# Patient Record
Sex: Male | Born: 1970 | Race: White | Hispanic: No | Marital: Married | Smoking: Never smoker
Health system: Southern US, Community
[De-identification: ages and names within clinical notes are randomized; demographics above are authoritative.]

## PROBLEM LIST (undated history)

## (undated) DIAGNOSIS — C8590 Non-Hodgkin lymphoma, unspecified, unspecified site: Secondary | ICD-10-CM

## (undated) DIAGNOSIS — F1010 Alcohol abuse, uncomplicated: Secondary | ICD-10-CM

## (undated) HISTORY — PX: BACK SURGERY: SHX140

## (undated) HISTORY — PX: CHOLECYSTECTOMY: SHX55

## (undated) HISTORY — PX: OTHER SURGICAL HISTORY: SHX169

## (undated) HISTORY — PX: INNER EAR SURGERY: SHX679

## (undated) HISTORY — PX: KNEE ARTHROSCOPY: SUR90

---

## 1999-10-06 ENCOUNTER — Ambulatory Visit (HOSPITAL_BASED_OUTPATIENT_CLINIC_OR_DEPARTMENT_OTHER): Admission: RE | Admit: 1999-10-06 | Discharge: 1999-10-06 | Payer: Self-pay | Admitting: Orthopedic Surgery

## 2000-02-06 ENCOUNTER — Encounter: Payer: Self-pay | Admitting: Emergency Medicine

## 2000-02-06 ENCOUNTER — Emergency Department (HOSPITAL_COMMUNITY): Admission: EM | Admit: 2000-02-06 | Discharge: 2000-02-06 | Payer: Self-pay | Admitting: Emergency Medicine

## 2002-06-15 DIAGNOSIS — C8590 Non-Hodgkin lymphoma, unspecified, unspecified site: Secondary | ICD-10-CM

## 2002-06-15 HISTORY — PX: AXILLARY LYMPH NODE BIOPSY: SHX5737

## 2002-06-15 HISTORY — DX: Non-Hodgkin lymphoma, unspecified, unspecified site: C85.90

## 2004-02-28 DIAGNOSIS — C8580 Other specified types of non-Hodgkin lymphoma, unspecified site: Secondary | ICD-10-CM | POA: Insufficient documentation

## 2004-03-03 ENCOUNTER — Other Ambulatory Visit: Admission: RE | Admit: 2004-03-03 | Discharge: 2004-03-03 | Payer: Self-pay | Admitting: Unknown Physician Specialty

## 2004-03-19 ENCOUNTER — Encounter (INDEPENDENT_AMBULATORY_CARE_PROVIDER_SITE_OTHER): Payer: Self-pay | Admitting: Oncology

## 2004-03-19 ENCOUNTER — Other Ambulatory Visit: Admission: RE | Admit: 2004-03-19 | Discharge: 2004-03-19 | Payer: Self-pay | Admitting: Oncology

## 2004-03-24 ENCOUNTER — Ambulatory Visit: Admission: RE | Admit: 2004-03-24 | Discharge: 2004-04-22 | Payer: Self-pay | Admitting: Radiation Oncology

## 2004-04-21 ENCOUNTER — Ambulatory Visit: Payer: Self-pay | Admitting: Oncology

## 2004-05-06 ENCOUNTER — Ambulatory Visit: Payer: Self-pay | Admitting: Hematology

## 2004-05-27 ENCOUNTER — Ambulatory Visit: Admission: RE | Admit: 2004-05-27 | Discharge: 2004-07-17 | Payer: Self-pay | Admitting: Radiation Oncology

## 2004-06-18 ENCOUNTER — Ambulatory Visit: Payer: Self-pay | Admitting: Oncology

## 2004-08-13 ENCOUNTER — Ambulatory Visit: Payer: Self-pay | Admitting: Oncology

## 2004-08-20 ENCOUNTER — Ambulatory Visit: Admission: RE | Admit: 2004-08-20 | Discharge: 2004-08-20 | Payer: Self-pay | Admitting: Radiation Oncology

## 2004-11-07 ENCOUNTER — Ambulatory Visit: Payer: Self-pay | Admitting: Oncology

## 2005-01-21 ENCOUNTER — Ambulatory Visit: Payer: Self-pay | Admitting: Oncology

## 2005-02-17 ENCOUNTER — Ambulatory Visit: Payer: Self-pay | Admitting: Critical Care Medicine

## 2005-02-20 ENCOUNTER — Ambulatory Visit: Admission: RE | Admit: 2005-02-20 | Discharge: 2005-02-20 | Payer: Self-pay | Admitting: Internal Medicine

## 2005-02-20 ENCOUNTER — Ambulatory Visit: Payer: Self-pay | Admitting: Critical Care Medicine

## 2005-02-20 ENCOUNTER — Encounter (INDEPENDENT_AMBULATORY_CARE_PROVIDER_SITE_OTHER): Payer: Self-pay | Admitting: Specialist

## 2005-02-26 ENCOUNTER — Ambulatory Visit: Payer: Self-pay | Admitting: Critical Care Medicine

## 2005-03-11 ENCOUNTER — Ambulatory Visit: Payer: Self-pay | Admitting: Oncology

## 2005-04-30 ENCOUNTER — Ambulatory Visit: Payer: Self-pay | Admitting: Critical Care Medicine

## 2005-04-30 ENCOUNTER — Ambulatory Visit: Payer: Self-pay | Admitting: Oncology

## 2005-05-02 ENCOUNTER — Emergency Department (HOSPITAL_COMMUNITY): Admission: EM | Admit: 2005-05-02 | Discharge: 2005-05-02 | Payer: Self-pay | Admitting: Emergency Medicine

## 2005-05-04 ENCOUNTER — Encounter (INDEPENDENT_AMBULATORY_CARE_PROVIDER_SITE_OTHER): Payer: Self-pay | Admitting: Specialist

## 2005-05-04 ENCOUNTER — Ambulatory Visit: Payer: Self-pay | Admitting: Critical Care Medicine

## 2005-05-04 ENCOUNTER — Ambulatory Visit: Admission: RE | Admit: 2005-05-04 | Discharge: 2005-05-04 | Payer: Self-pay | Admitting: Critical Care Medicine

## 2005-05-06 ENCOUNTER — Ambulatory Visit (HOSPITAL_COMMUNITY): Admission: RE | Admit: 2005-05-06 | Discharge: 2005-05-06 | Payer: Self-pay | Admitting: Critical Care Medicine

## 2005-07-13 ENCOUNTER — Ambulatory Visit: Payer: Self-pay | Admitting: Oncology

## 2005-07-28 ENCOUNTER — Observation Stay (HOSPITAL_COMMUNITY): Admission: EM | Admit: 2005-07-28 | Discharge: 2005-07-29 | Payer: Self-pay | Admitting: Emergency Medicine

## 2005-07-28 ENCOUNTER — Ambulatory Visit: Payer: Self-pay | Admitting: Family Medicine

## 2005-10-05 ENCOUNTER — Ambulatory Visit: Payer: Self-pay | Admitting: Oncology

## 2006-03-31 ENCOUNTER — Ambulatory Visit: Payer: Self-pay | Admitting: Oncology

## 2006-06-30 ENCOUNTER — Ambulatory Visit: Payer: Self-pay | Admitting: Oncology

## 2006-10-04 ENCOUNTER — Ambulatory Visit: Payer: Self-pay | Admitting: Oncology

## 2007-01-02 ENCOUNTER — Emergency Department (HOSPITAL_COMMUNITY): Admission: EM | Admit: 2007-01-02 | Discharge: 2007-01-03 | Payer: Self-pay | Admitting: Emergency Medicine

## 2007-01-12 ENCOUNTER — Emergency Department (HOSPITAL_COMMUNITY): Admission: EM | Admit: 2007-01-12 | Discharge: 2007-01-12 | Payer: Self-pay | Admitting: Emergency Medicine

## 2007-01-15 ENCOUNTER — Ambulatory Visit (HOSPITAL_COMMUNITY): Admission: RE | Admit: 2007-01-15 | Discharge: 2007-01-15 | Payer: Self-pay | Admitting: Emergency Medicine

## 2007-01-26 ENCOUNTER — Inpatient Hospital Stay (HOSPITAL_COMMUNITY): Admission: RE | Admit: 2007-01-26 | Discharge: 2007-01-28 | Payer: Self-pay | Admitting: Neurosurgery

## 2007-02-09 ENCOUNTER — Ambulatory Visit: Payer: Self-pay | Admitting: Oncology

## 2007-03-25 ENCOUNTER — Emergency Department (HOSPITAL_COMMUNITY): Admission: EM | Admit: 2007-03-25 | Discharge: 2007-03-26 | Payer: Self-pay | Admitting: Emergency Medicine

## 2009-02-12 IMAGING — CT CT L SPINE W/O CM
4 series · 17 of 36 positions shown, 19 images · IV contrast (agent unspecified)
Comparison: none

CLINICAL DATA: 36-year-old with back injury.  
 LUMBAR SPINE CT WITHOUT CONTRAST:
TECHNIQUE: Multidetector CT imaging of the lumbar spine was performed.  Multiplanar CT image reconstructions were also generated.

[Series 2: l-spine helical · axial · 0.35mm/px · z∈[-267,-79]mm · 5 of 104 slices shown, 7 images (1 of 2)]
[im 18/104  soft-tissue]
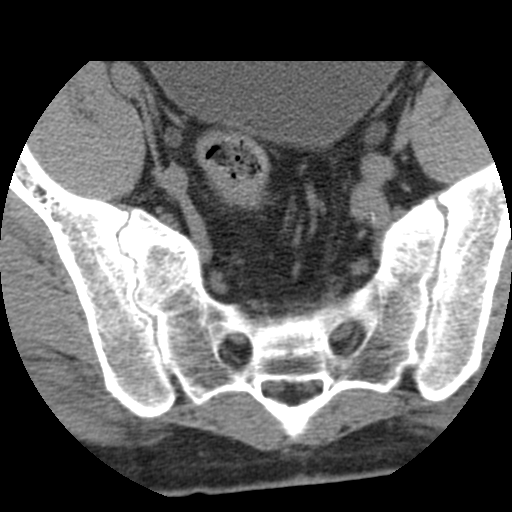
[im 18/104  bone]
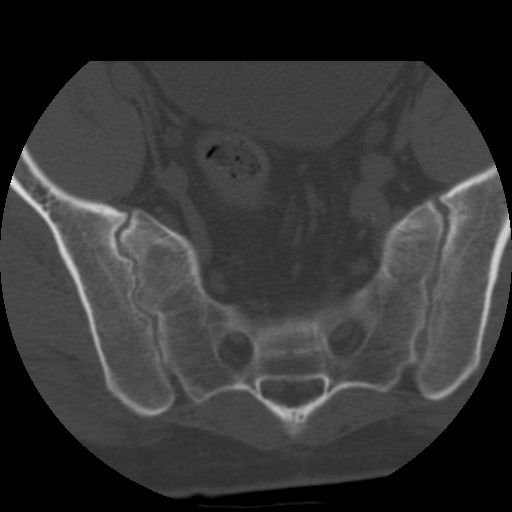
[im 35/104  bone]
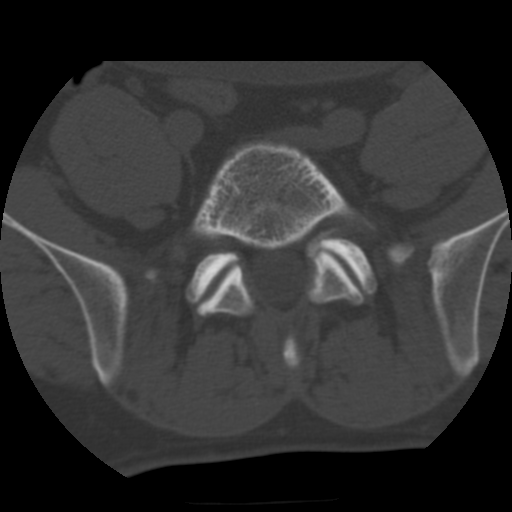
[im 52/104  bone]
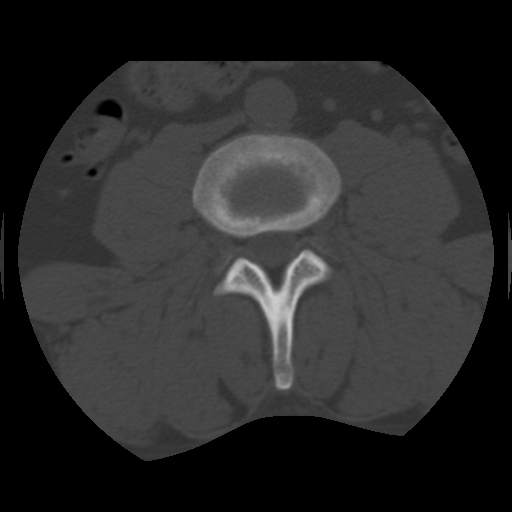
[im 69/104  bone]
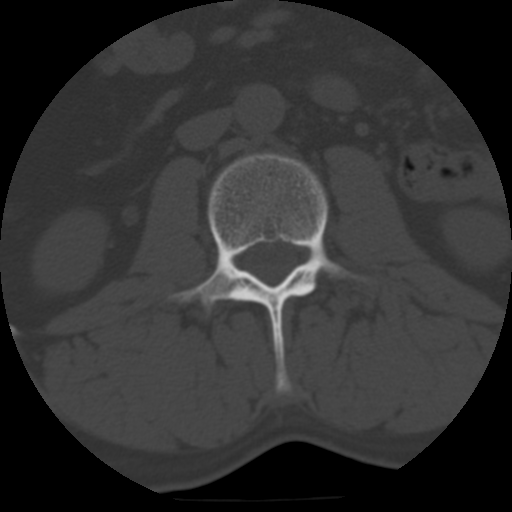
[im 86/104  soft-tissue]
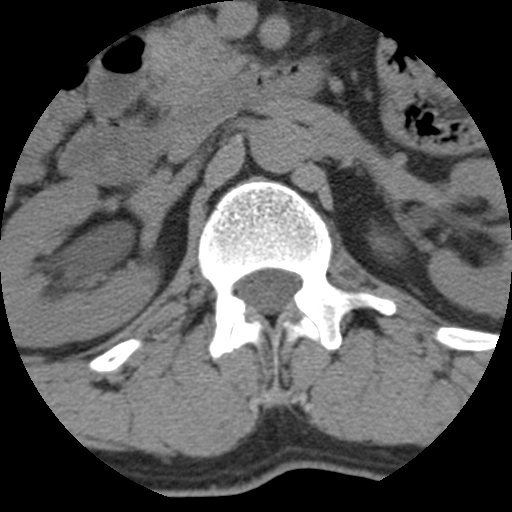
[im 86/104  bone]
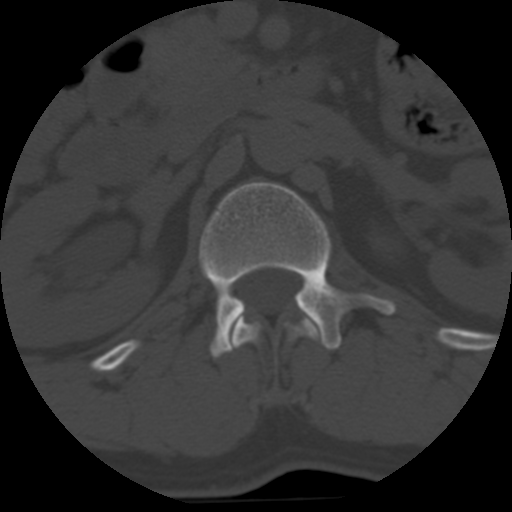

[Series 102: l-spine helical · axial · 0.35mm/px · z∈[-264,-172]mm · 3 of 110 slices shown (2 of 2)]
[im 19/110  bone]
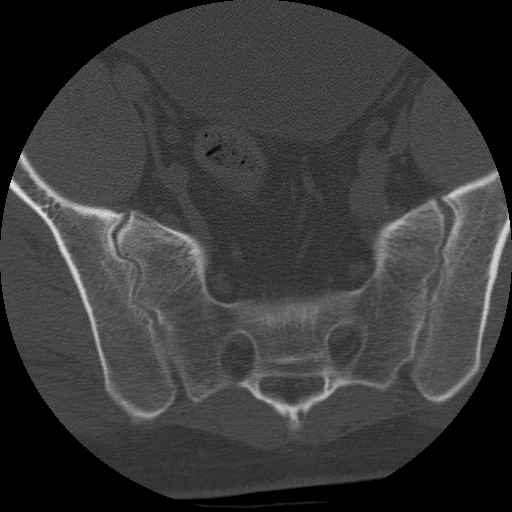
[im 37/110  bone]
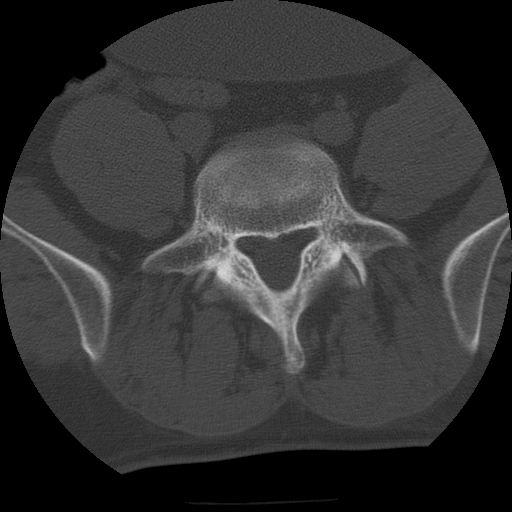
[im 55/110  bone]
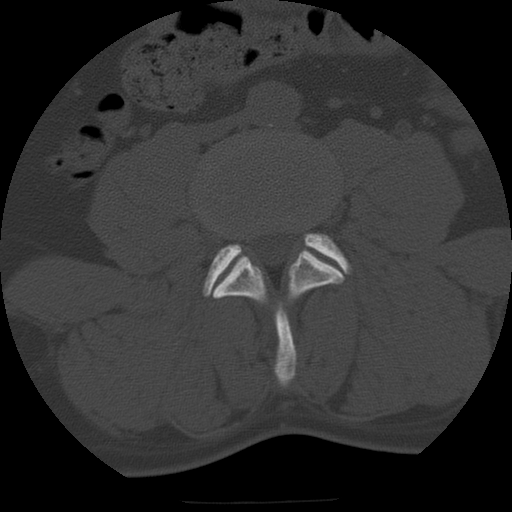

[Series 400: reformatted · coronal · 0.55mm/px · 6 of 60 slices shown (1 of 2)]
[im 19/60  soft-tissue]
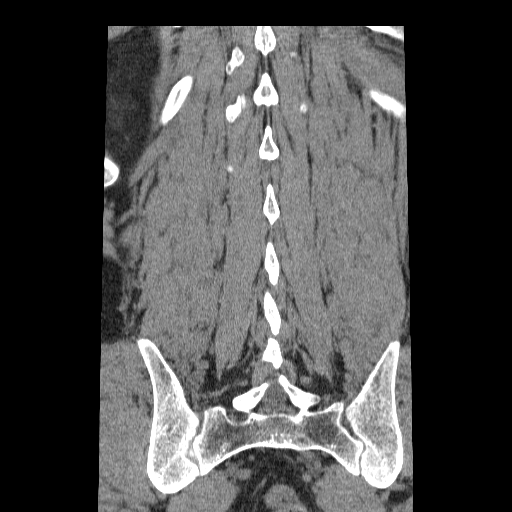
[im 20/60  bone]
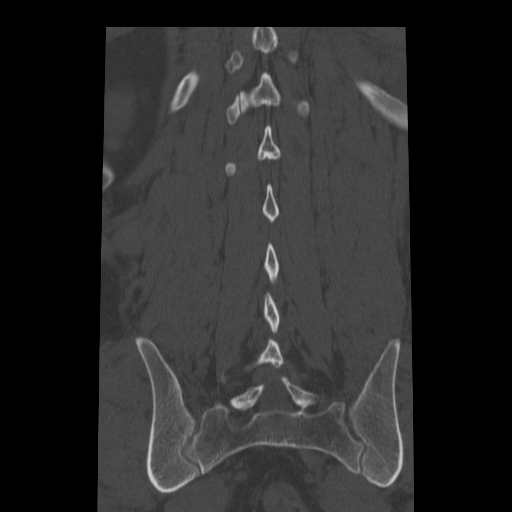
[im 25/60  bone]
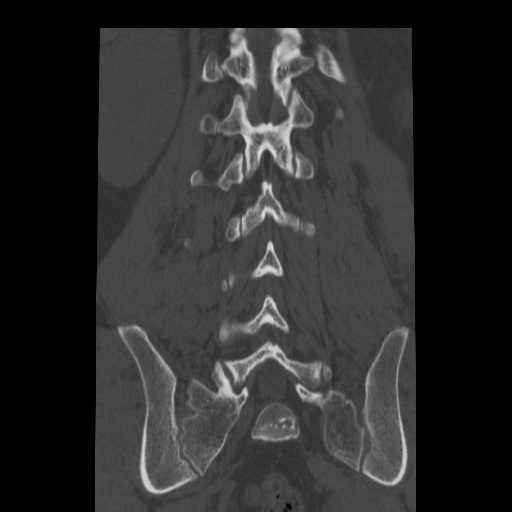
[im 30/60  bone]
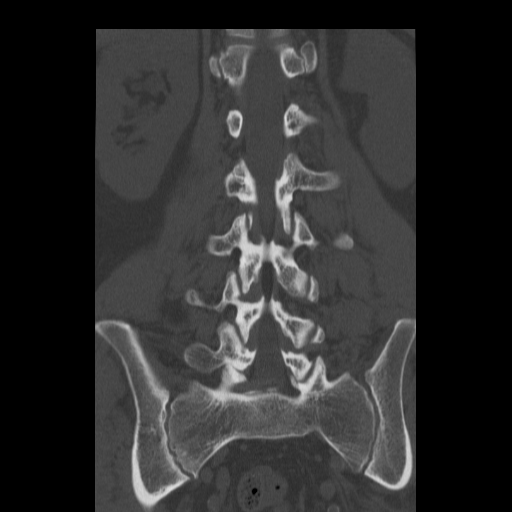
[im 35/60  bone]
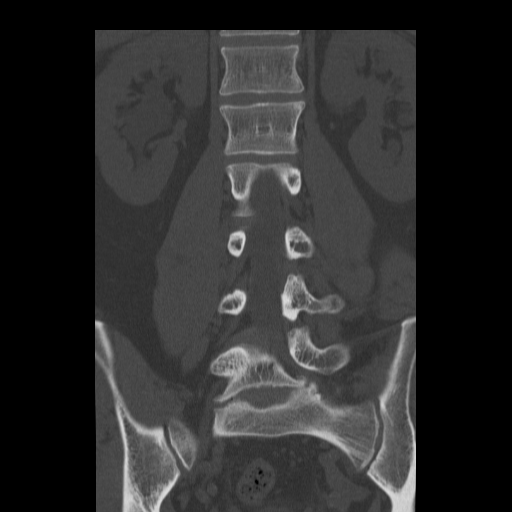
[im 40/60  bone]
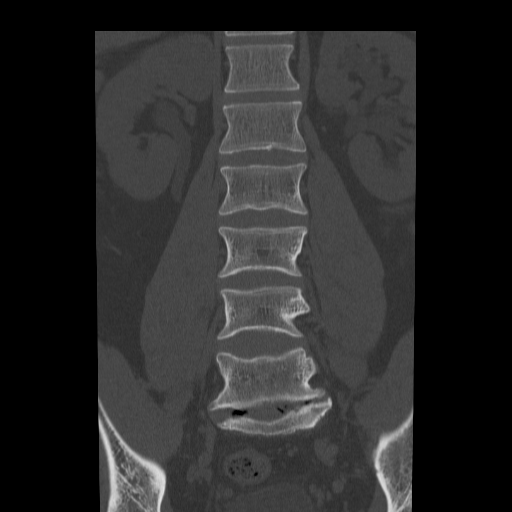

[Series 401: reformatted · sagittal · 0.55mm/px · 3 of 67 slices shown (2 of 2)]
[im 14/67  bone]
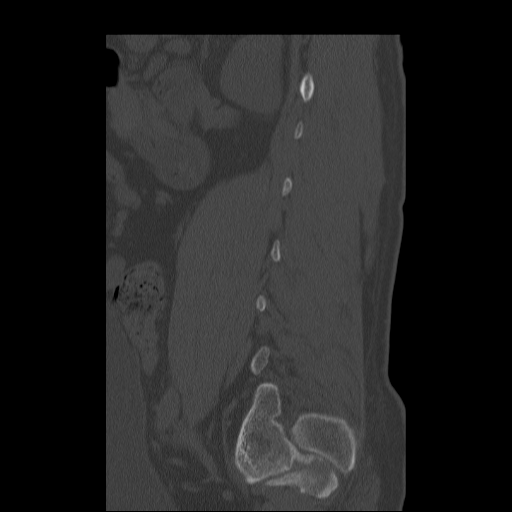
[im 27/67  bone]
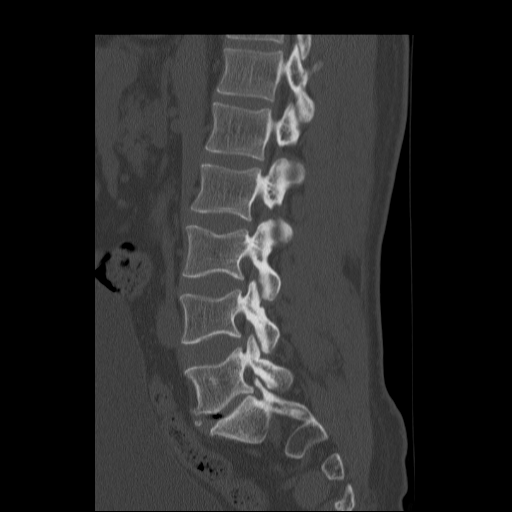
[im 40/67  bone]
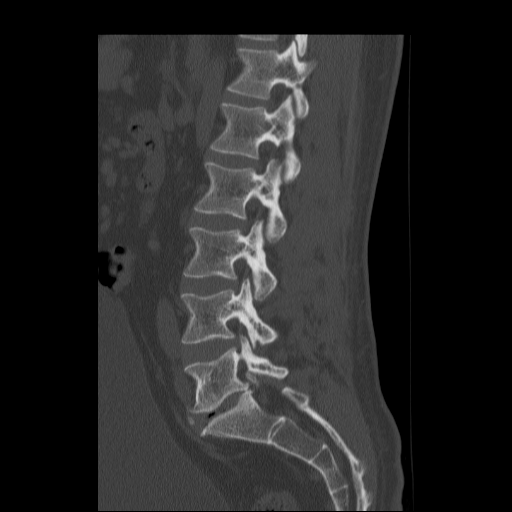

[17 of 36 positions shown; findings below may reference images not displayed]

FINDINGS: The sacroiliac joints are intact.  Negative for acute fracture or dislocation.  The urinary bladder appears to be markedly distended.  There is disc space loss and vacuum disc phenomena at L5-S1.  No evidence for a pars defect.  No evidence for spondylolisthesis.  Vertebral body heights are maintained.  No significant disc herniations are appreciated.
IMPRESSION: 1.  No acute bone abnormalities of the lumbar spine. 
 2.  Degenerative disease at L5-S1.  
 3.  Massive dilatation of the urinary bladder.

## 2009-07-07 DIAGNOSIS — C8599 Non-Hodgkin lymphoma, unspecified, extranodal and solid organ sites: Secondary | ICD-10-CM | POA: Insufficient documentation

## 2010-10-28 NOTE — Op Note (Signed)
NAME:  Craig Davenport, Craig Davenport NO.:  [id number]   MEDICAL RECORD NO.:  [id number]          PATIENT TYPE:  INP   LOCATION:  3033                         FACILITY:  [facility]   PHYSICIAN:  Hilda Lias, M.D.   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   PROCEDURE:  Bilateral L5-S1 foraminotomy, laminotomy, decompression of  the L5 and S1 nerve roots.  Microscope.   SURGEON:  Hilda Lias, M.D.   ASSISTANT:  Hewitt Shorts, M.D.   CLINICAL HISTORY:  The patient has a history of a lymphoma and he has  been complaining of chronic with acute pain going to both lower  extremities.  He has had conservative treatment including chiropractic  treatment, epidural injection, medication, with no help.  CT scan shows  stenosis at the level of L5-S1.  The rest of the spine is normal.  The  surgery was advised and the risks were explained to him.   PROCEDURE:  The patient was taken to the OR and after intubation, he was  positioned in a prone manner.  The wound was cleaned with DuraPrep.  Then we identified the L5 spinous process and drapes were applied.  A  midline incision was made with retraction of the muscle bilaterally.  Then we identified the L5-S1 space, which is the last one from below.  X-  rays showed that indeed we were at the level of L5-1.  Then with the  drill we did a laminotomy of L5-S1.  A thick yellow ligament was also  excised.  We found that he has quite a bit of adhesion from the nerve  root down to the canal.  The area was full of previous Depo-Medrol  medication, which was seen as whitish in the epidural space.  Then with  the 2  and 3-mm Kerrison punch we did a foraminotomy to decompress the  both L5 nerve roots.  We went laterally and we found that indeed the L5  nerve root was open.  From then on the area was irrigated.  Hemostasis  was accomplished with bipolar.  Then Depo-Medrol and fentanyl were left  in the pleural space and the wound was closed with Vicryl and a Steri-  Strip.           ______________________________  Hilda Lias, M.D.     EB/MEDQ  D:  01/26/2007  T:  01/27/2007  Job:  [id number]

## 2010-10-31 NOTE — H&P (Signed)
NAME:  Craig Davenport, Craig Davenport NO.:  [id number]   MEDICAL RECORD NO.:  [id number]          PATIENT TYPE:  INP   LOCATION:  3004                         FACILITY:  [facility]   PHYSICIAN:  Santiago Bumpers. Hensel, M.D.DATE OF BIRTH:  02-Sep-1970   DATE OF ADMISSION:  07/28/2005  DATE OF DISCHARGE:                                HISTORY & PHYSICAL   PHYSICIANS:  Primary care physician: Lianne Bushy, M.D.  Attending on date of admission: Chrissie Noa A. Hensel, M.D.   CHIEF COMPLAINT:  Right-sided numbness upper extremity and lower extremity  x10 hours.   HISTORY OF PRESENT ILLNESS:  The patient is a 40 year old white male with  past medical history significant for non-Hodgkin's Lymphoma, stage I, status  post chemotherapy and radiation with CHOP and Rituxan x3 cycles 2 years ago.  He presented to the ED with complaint of right-sided pins and needles  feeling in his upper extremity and lower extremity on the right side.  The  patient was drinking alcohol last evening, and it is known he drank  approximately 1 case per day and fell asleep in the chair, unknown of his  positioning, but claims he was not on outstretched arm or leg.  The patient  fell asleep in a recliner and woke up this morning around 4:30 a.m. with  numbness and pins and needles and right-sided weakness that has not improved  in the last 10 hours.  The patient has no history of traveling,  paresthesias.  No trauma to the right side.  The patient said he felt funny  the day prior to the event with decreased p.o. intake but no history of sick  contacts or viral or bacterial illness.  The patient complained of right-  sided chest pain as described with pins and needles as well; however, did  not radiate anywhere, go down his arm, just seemed in confluence with the  associated right upper extremity and lower extremity pain.   REVIEW OF SYSTEMS:  The patient had a history of slurred speech on Sunday;  however, this has  resolved since then. No fevers, no chills, no headaches,  no vision changes. CARDIOVASCULAR: No history of heart problems, occasional  PVCs.  CHEST: He has baseline shortness of breath status post radiation.  There is a history of lymphoma.  GI: No nausea, vomiting, or diarrhea.  NEUROLOGIC: No history of TIAs or strokes.   PAST MEDICAL HISTORY:  Significant for:  1.  Non-Hodgkin's lymphoma, stage I, that is status post chemoradiation and      per patient states he had surgery, but per notes, there is no noted      documentation resection.  2.  History of liver disease secondary to alcoholism.   PAST SURGICAL HISTORY:  1.  Per patient, secondary to lymphoma as well.  2.  Knee surgery, orthoscopic, in 2001.   HOME MEDICATIONS:  None.   PRIMARY CARE PHYSICIAN:  Lianne Bushy, M.D.   CONSULTATIONS:  Oncology: Dellia Beckwith, M.D., [facility].   SOCIAL HISTORY:  The patient has a history of smokeless tobacco, chronic  alcohol use  of 1 case of beer a night.  No illicit drug use, no history of  tobacco smoking.   FAMILY HISTORY:  The patient is single, lives in [place].  His mother and  father: No significant history for CVA, cardiovascular disease, heart  attacks, cancer, diabetes, or hypertension.   PHYSICAL EXAMINATION:  VITAL SIGNS:  Temperature 98.4, heart rate 97, blood  pressure 135/69, respirations 20, O2 saturation 93% to 96% on room air.  GENERAL:  No acute distress, sleepy, conversational, slight slurred speech  per his family members.  HEENT:  Normocephalic and atraumatic.  Pupils equal, round, and reactive to  light bilaterally.  Extraocular muscles intact.  Moist mucous membranes.  No  oral lesions.  CARDIOVASCULAR:  Regular rate and rhythm.  No murmurs, rubs, or gallops.  No  carotid bruits. No JVD.  LUNGS: Clear to auscultation bilaterally.  Good symmetrical movement.  ABDOMEN:  Positive tenderness to palpation of right upper quadrant.  No   hepatosplenomegaly. Positive bowel sounds, no rebound.  Soft, nondistended.  No spider hemangiomas.  EXTREMITIES:  4/5 strength in right upper extremity.  He was able to resist  gravity.  He had full range of motion, decreased resistance strength  however. He also had 4/5 strength in his right lower extremity against  resistance, full range of motion.  There was 5/5 strength left side, upper  and lower extremities.  NEUROLOGIC: Cranial nerves II-XII intact.  Proprioception was intact.  Gait  was normal with mild guarding on the right felt secondary to sensation pain.  There is no sensation of sharp or soft touch on the right hand or the right  foot.  There is positive sharp and soft sensation on the left.  NECK:  Flexion was intact with extension.  There was decreased range of  motion in the neck rotation.   LABORATORY DATA:  White blood count 4.9, hemoglobin 16.3, hematocrit 46.9,  platelets 307, MCV 91.8, MCHC 34.7, RDW 13.4.  Electrolytes were normal with  a sodium of 136, potassium 4.2, chloride 100, bicarb 27, BUN 6, creatinine  1. Calcium 8.7, total protein 7, AST elevated at 111, ALT elevated at 88,  albumin 4.4, alkaline phosphatase 66, total bilirubin 0.7.  Alcohol level  was noted to be elevated at 242.   CT of the head was performed in the ED which was negative for acute changes  or strokes.   Chest x-ray: No evidence for acute chest disease.   EKG showed normal sinus rhythm and no ST elevation.   Differential diagnoses include alcohol conversion syndrome versus acute  stroke versus transient ischemic attack versus cord compression versus  atypical presentation of Guillain-Barre; however, this is less l likely  secondary to asymmetric appearance of the focal weakness and sensation loss.   ASSESSMENT AND PLAN:  This is 40 year old white male with acute neurosensory  loss right upper extremity and right lower extremity with right-sided chest pain and right upper quadrant  abdominal pain.   PROBLEM:  1.  Right-sided neurosensory loss in patient with acute symptoms for      approximately 10 hours.  CT of the head was negative.  Differential      includes alcoholic conversion syndrome secondary to the patient's      alcohol of 244 and unknown sleep position and acute sensory loss.      However, we are unable to pinpoint specific peripheral nerve pattern or      dermatome to match the sensory loss. We will place patient  on alcohol      withdrawal protocol of Librium and reevaluate once the patient is      recovered from current alcoholic state.  See if the sensory loss pattern      improves.  Differential also includes cord compression, and we will      obtain MRI of the head and neck to rule out immediate concerns.      Differential includes acute stroke; however, patient without significant      risk factors or family history or age, showing him to be young at 3      years.  MRI of the head can show if there are ischemic changes.  We will      not obtain an MRA at this time.  We will follow his physical exam      closely.  This is likely not Guillain-Barre secondary to asymmetric      weakness on one side. Slurred speech currently present to go along with      the above spinal cord for questionable etiology.  2.  Right upper quadrant pain.  Most likely this is secondary to alcoholism      with elevated AST greater than ALT.  Alkaline phosphatase is not      elevated.  In patient with a history of alcohol abuse, most likely      secondary to alcohol, acute stretching of Gleason's capsule.  We will go      ahead and follow LFTs in the morning.  We will continue to check      physical exams.  We will give morphine for pain.  We will also check a      PT and INR for elevated liver enzymes.  3.  Alcohol abuse.  We will place patient on a Librium protocol and monitor      for delirium tremens. We will consult social work for alcohol abuse.  We      will give  vitamins, thiamine, and folate.  4.  Fluid, electrolytes, and nutrition and gastrointestinal.  We will start      him on a regular diet, place him on Protonix IV 40 mg daily.  Will      heparin lock his IV and check electrolytes in the morning.  5.  Disposition.  This is pending on Problems #1 and #3.      Barth Kirks, M.D.    ______________________________  Santiago Bumpers. Leveda Anna, M.D.    MB/MEDQ  D:  07/28/2005  T:  07/28/2005  Job:  [id number]

## 2010-10-31 NOTE — Discharge Summary (Signed)
NAME:  Craig Davenport, HORSCH NO.:  [id number]   MEDICAL RECORD NO.:  [id number]          PATIENT TYPE:  INP   LOCATION:  3004                         FACILITY:  [facility]   PHYSICIAN:  Leighton Roach McDiarmid, M.D.DATE OF BIRTH:  July 03, 1970   DATE OF ADMISSION:  07/28/2005  DATE OF DISCHARGE:  07/29/2005                                 DISCHARGE SUMMARY   DISCHARGE DIAGNOSES:  1.  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  2.  Alcohol abuse.   DISCHARGE MEDICATIONS:  Ativan protocol for alcohol withdrawal:  Ativan 2 mg  take 2 tablets every 8 hours x1 day, then Ativan 1 mg take 1 tablet every 8  hours x1 day, then Ativan 1 mg take 1 tablet every 12 hours x1 day, then  Ativan 1 mg take 1 tablet once daily, then stop.   PROCEDURES:  1.  CT of the head, which showed no acute signs of stroke.  2.  MRI/MRA of the head showed mild motion degradation but overall, the      study is diagnostic.      1.  No evidence for acute ischemia. Diffusion images are negative.      2.  No focal or acute intracranial lesions and no abnormal intracranial          enhancement.  3.  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      1.  Dimunitive left vertebral, which is does not contribute to          inflammation of the basilar.      2.  Hypoplastic or diseased A1 segment, right anterior cerebral artery.      3.  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  4.  MR angiography of the neck with contrast shows unremarkable MR      angiography of external circulation. There is a 2 mm area of abnormal      signal at A2 segment of the left anterior cerebral artery just distal to      extension with the anterior communicating artery. The radiologist could      not exclude a small berry aneurysm. Formal angiography may be necessary      to further evaluation. Distal MCA and  PCA branches appear unremarkable.  5.  Chest x-ray on July 28, 2005 showed no evidence for active chest      disease. There were surgical clips seen within the right axillary      region. No mediastinal hilar adenopathy.   LABORATORY DATA:  PT of 12.3, INR 0.9. CMP on date of admission:  Sodium  136, potassium 4.2, chloride 100, bicarb 27, glucose 89, BUN 6, creatinine  1.0, total bili 0.7, alkaline phosphatase 66. AST was elevated at 111. ALT  was elevated at 88. Total protein was 7.0. Albumin was 4.4. Calcium 8.7. CMP  done on July 29, 2005 showed improvement in AST down to 81 and ALT down  to 68 with  alkaline phosphatase down to 59. CBC on day of admission showed  white blood cell count of 4.9, hemoglobin 16.3, hematocrit 46.9, platelets  of 307. Neutrophils of 55% and lymphocytes 16%. MCV was 91.8.   HISTORY OF PRESENT ILLNESS:  The patient is a 40 year old white male with  significant past medical history for non-Hodgkin's lymphoma, stage 1 status  post chemotherapy and radiation and resection. The patient presented with a  10 hour history of acute onset of right sided weakness in the right upper  and right lower extremity with positive sensation of pins and needles in the  right upper extremity and right lower extremity, as well as some atypical  chest pain on the right side associated with pins and needles type sensation  as well as some slightly slurred speech. The patient's alcohol level was  noted to be 242 on admission to the ED. Please see the dictated H&P for  further details.   HOSPITAL COURSE:  [name] was decided to be brought in, admitted to the  hospital and there was concern for acute stroke versus cord compression  syndrome versus a drunken compression neuropathy.   PROBLEM LIST:  #1.  WEAKNESS WITH SENSORY NEURO LOSS IN THE RIGHT UPPER  EXTREMITY AND LOWER EXTREMITY. MRI/MRA were obtained with noted detailed as  above, which were negative for acute  stroke, ischemic or hemorrhagic. There  were no noted lesions that could associate or identify this type of pain  pattern. The patient had noted improvement after day 1 of overnight  hospitalization with the sensory pins and needles, positive sensation  related solely to the ulnar distribution in the right 4th and 5th metacarpal  hands extending to the forearm as well as the right 4th and 5th metatarsals,  extending on to the anterior plane of the foot, up into the anterior calf.  The patient had 3 out of 5 weakness on the right and the left. He did have  ability to move his hand against gravity, however, there was noted weakness  against resistance. Otherwise, left extremities were fine. The patient was  noted to have improvement and this was most likely thought to be secondary  to a drunken compression nerve palsy, which should improve over time. The  patient was given instructions and noted that his weakness may continue to  resume for several weeks. However, he should note improvement with improved  sensation over the next following weeks. The patient will not need PT or OT  therapy for resolution of his symptoms.   #2.  ALCOHOL ABUSE:  The patient with known alcohol use with elevated  alcohol level of 242 after awakening from a night of drinking. He admits to  drinking approximately 1 case of beer a night. The patient was placed on  Ativan protocol, withdrawal protocol, and started on Ativan 2 mg q.6 hours  on the day of admission and then changed down to 2 mg q.8 hours x1 day, then  Ativan 1 mg q.8 hours x1 day, then Ativan 1 mg q.12 hours x1 day, then  Ativan 1 q.24 hours, then stop. The patient was also given Thiamine and  folic acid supplementation. The patient was noted to have some slightly  slurred speech during admission in the emergency department. He was noted to  be somewhat drowsy secondary to the Ativan, however, his speech was improved on the day of discharge. The  patient has admitted that he has an alcohol  problem and wants to quit. The patient  was consulted by social work and will  be presenting options to him of inpatient rehabilitation versus outpatient  rehabilitation with following AA therapy as well as continuing the Ativan  protocol as an outpatient. The patient is currently still undecided at the  time of the dictation. Appointment has been made with Dr. Harlene Salts office for  Friday at 11:15 on July 31, 2005. The patient was given 2 prescriptions  for the first 2 days of Ativan taper, to be filled only on those 2 days and  Dr. Purnell Shoemaker will fill the following 2 days worth of Ativan taper upon the  patient completing and meeting his appointment. If patient does decide to go  to inpatient rehabilitation therapy, his Ativan taper will still be the  same. The patient does have insurance and is currently making the decision  at time of discharge.   #3.  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 His liver enzymes are noted to improve with  resolution of the alcoholism. This is most likely something that has been  going on and he will not need further followup. The patient was recommended  to discontinue his alcohol usage.   #4.  FEN/GI: This was stable. The patient was slightly nauseous on day of  discharge, however, he contributed this secondary to not enjoying the food.  No nausea and vomiting on day of discharge.   DISCHARGE VITAL SIGNS:  Temperature 97.9, pulse 102, respiratory rate 20,  blood pressure 140's over 87. He is satting 96% on room air.   DISPOSITION:  The patient has reached maximum benefit of this  hospitalization and is now ready for discharge home.   FOLLOW UP:  The patient will followup with Dr. Purnell Shoemaker in [place], [place] on July 31, 2005 at 11:15 in the a.m.   SPECIAL INSTRUCTIONS:  He will be sent home with medications. He is  instructed not to  continue alcohol and to continue his Ativan taper. If the  patient does choose to continue his alcoholism, he is to discontinue the  Ativan taper immediately and he acknowledges as well as girlfriend and  mother acknowledge an understanding of this agreement. The patient denies  any suicide ideation or homicide ideation on time of discharge.      Barth Kirks, M.D.    ______________________________  Leighton Roach McDiarmid, M.D.    MB/MEDQ  D:  07/29/2005  T:  07/29/2005  Job:  [id number]   cc:   Lianne Bushy, M.D.  Fax: [phone]

## 2011-03-24 ENCOUNTER — Emergency Department (HOSPITAL_COMMUNITY)
Admission: EM | Admit: 2011-03-24 | Discharge: 2011-03-25 | Disposition: A | Discharge status: Other Acute Inpatient Hospital | Payer: Medicaid Other | Source: Home / Self Care | Attending: Emergency Medicine | Admitting: Emergency Medicine

## 2011-03-24 DIAGNOSIS — F1010 Alcohol abuse, uncomplicated: Secondary | ICD-10-CM | POA: Insufficient documentation

## 2011-03-24 LAB — BASIC METABOLIC PANEL
BUN: 3 mg/dL — ABNORMAL LOW (ref 6–23)
CO2: 27 mEq/L (ref 19–32)
Chloride: 94 mEq/L — ABNORMAL LOW (ref 96–112)
Creatinine, Ser: 0.61 mg/dL (ref 0.50–1.35)
GFR calc Af Amer: >90 mL/min (ref >90)
GFR calc non Af Amer: >90 mL/min (ref >90)
Glucose, Bld: 96 mg/dL (ref 70–99)
Potassium: 3.6 mEq/L (ref 3.5–5.1)
Sodium: 134 mEq/L — ABNORMAL LOW (ref 135–145)

## 2011-03-24 LAB — DIFFERENTIAL
Basophils Absolute: 0.2 10*3/uL — ABNORMAL HIGH (ref 0.0–0.1)
Basophils Relative: 2 % — ABNORMAL HIGH (ref 0–1)
Eosinophils Absolute: 0.3 10*3/uL (ref 0.0–0.7)
Lymphocytes Relative: 20 % (ref 12–46)
Lymphs Abs: 1.5 10*3/uL (ref 0.7–4.0)
Monocytes Absolute: 0.8 10*3/uL (ref 0.1–1.0)
Monocytes Relative: 10 % (ref 3–12)
Neutro Abs: 5 10*3/uL (ref 1.7–7.7)

## 2011-03-24 LAB — CBC
HCT: 44.5 % (ref 39.0–52.0)
Hemoglobin: 15.8 g/dL (ref 13.0–17.0)
MCHC: 35.5 g/dL (ref 30.0–36.0)
MCV: 96.9 fL (ref 78.0–100.0)
RBC: 4.59 MIL/uL (ref 4.22–5.81)
RDW: 13.6 % (ref 11.5–15.5)
WBC: 7.8 10*3/uL (ref 4.0–10.5)

## 2011-03-24 LAB — ETHANOL: Alcohol, Ethyl (B): 345 mg/dL — ABNORMAL HIGH (ref 0–11)

## 2011-03-25 ENCOUNTER — Inpatient Hospital Stay (HOSPITAL_COMMUNITY)
Admission: AD | Admit: 2011-03-25 | Discharge: 2011-03-30 | DRG: 897 | Disposition: A | Discharge status: Home / Self Care | Payer: Medicaid Other | Source: Ambulatory Visit | Attending: Psychiatry | Admitting: Psychiatry

## 2011-03-25 DIAGNOSIS — G4700 Insomnia, unspecified: Secondary | ICD-10-CM

## 2011-03-25 DIAGNOSIS — F1020 Alcohol dependence, uncomplicated: Principal | ICD-10-CM

## 2011-03-25 DIAGNOSIS — G250 Essential tremor: Secondary | ICD-10-CM

## 2011-03-25 DIAGNOSIS — Z79899 Other long term (current) drug therapy: Secondary | ICD-10-CM

## 2011-03-25 DIAGNOSIS — K759 Inflammatory liver disease, unspecified: Secondary | ICD-10-CM

## 2011-03-25 DIAGNOSIS — Z87898 Personal history of other specified conditions: Secondary | ICD-10-CM

## 2011-03-25 LAB — HEPATIC FUNCTION PANEL
ALT: 79 U/L — ABNORMAL HIGH (ref 0–53)
Alkaline Phosphatase: 276 U/L — ABNORMAL HIGH (ref 39–117)
Bilirubin, Direct: 0.5 mg/dL — ABNORMAL HIGH (ref 0.0–0.3)
Indirect Bilirubin: 0.6 mg/dL (ref 0.3–0.9)
Total Bilirubin: 1.1 mg/dL (ref 0.3–1.2)
Total Protein: 6 g/dL (ref 6.0–8.3)

## 2011-03-25 LAB — RAPID URINE DRUG SCREEN, HOSP PERFORMED
Amphetamines: NOT DETECTED
Barbiturates: POSITIVE — AB
Benzodiazepines: NOT DETECTED
Cocaine: NOT DETECTED

## 2011-03-25 LAB — ETHANOL: Alcohol, Ethyl (B): 94 mg/dL — ABNORMAL HIGH (ref 0–11)

## 2011-03-30 LAB — BASIC METABOLIC PANEL
BUN: 10
CO2: 28
Calcium: 9.2
Chloride: 100
Creatinine, Ser: 0.8
GFR calc Af Amer: >60
GFR calc non Af Amer: >60
Glucose, Bld: 95
Potassium: 4
Sodium: 139

## 2011-03-30 LAB — HEPATIC FUNCTION PANEL
ALT: 63 — ABNORMAL HIGH
AST: 37
Albumin: 4.2
Alkaline Phosphatase: 54
Bilirubin, Direct: 0.2
Indirect Bilirubin: 0.9
Total Bilirubin: 1.1
Total Protein: 7.4

## 2011-03-30 LAB — CBC
HCT: 44.2
Hemoglobin: 15.3
MCHC: 34.6
MCV: 91.2
Platelets: 249
RBC: 4.85
RDW: 14.1 — ABNORMAL HIGH
WBC: 7.9

## 2011-03-30 LAB — URINALYSIS, ROUTINE W REFLEX MICROSCOPIC
Bilirubin Urine: NEGATIVE
Glucose, UA: NEGATIVE
Hgb urine dipstick: NEGATIVE
Ketones, ur: NEGATIVE
Nitrite: NEGATIVE
Protein, ur: NEGATIVE
Specific Gravity, Urine: 1.006
Urobilinogen, UA: 0.2
pH: 5.5

## 2011-04-01 NOTE — Discharge Summary (Signed)
NAME:  Craig Davenport, Craig Davenport NO.:  [id number]  MEDICAL RECORD NO.:  [id number]  LOCATION:  0303                          FACILITY:  [facility]  PHYSICIAN:  Orson Aloe, MD       DATE OF BIRTH:  Apr 26, 1971  DATE OF ADMISSION:  03/25/2011 DATE OF DISCHARGE:  03/30/2011                              DISCHARGE SUMMARY   IDENTIFYING INFORMATION:  This is a 40 year old divorced Caucasian male. This is a voluntary admission.  HISTORY OF PRESENT ILLNESS:  Craig Davenport presented requesting alcohol detox. His last drink had been immediately prior to presenting in our emergency room.  He presented with an alcohol level of 345 mg/dL and urine drug screen was noted to be positive for barbiturates.  He reported that his physician at the [facility] in [facility] had told him that he would die of liver disease if he did not come off the alcohol.  He admitted to drinking 16-18 twelve-ounce beers on a typical day and is requesting detox denying any dangerous thoughts.  MEDICAL EVALUATION:  He was medically evaluated and cleared in our emergency room where his full physical exam and review of systems is documented. He denied any history of delirium tremens, withdrawal seizures or blackouts.  His past medical history is significant for history of non-Hodgkin's lymphoma, stage I with previous treatment, recent cholecystectomy and history of chronic alcohol abuse.  Vital signs were noted to be normal.  Liver enzymes were noted to be elevated with an SGOT of 150, SGPT of 78, and alkaline phosphatase elevated at 276.  COURSE OF HOSPITALIZATION:  He was admitted to our detox unit and was started on a Librium protocol with a goal of a safe detox in 4 days and given a provisional diagnosis of alcohol dependence.  He reported in group therapy several motivations for wanting to achieve sobriety and maintain it, including his desire to improve his physical health and he has a history of 2 driving  while impaired charges and currently has no drivers license because of that.  He reported he had previously been through alcohol rehab programs in the past, including the [facility].  He has a history of 2 years of sobriety.  He lives with his mother who was supportive.  We increased our Librium protocol to address his symptoms and the dose was increased to 50 mg in tapering fashion from our usual 25 mg protocol.  His detox was uneventful, and after discussion of risks and benefits, he agreed to go on Campral for relapse prevention.  He was ready for discharge on the 15th.  FOLLOW UP PLAN:  He will follow up at [facility]. He was given their dates and times and he will follow up at [facility] in [place], [place], on October 15 at 12 noon.  DISCHARGE DIAGNOSIS:  Axis I:  Alcohol dependence. Axis II:  Deferred. Axis III:  Benign familial tremor. Axis IV:  Moderate psychosocial issues related to substance abuse. Axis V:  Current 55, past year not known.  DISCHARGE MEDICATIONS: 1. Acamprosate 333 mg take 2 caps 3 times daily. 2. Librium 25 mg with the evening meal  on October 15, 25 mg b.i.d. on     October 16 and 25 mg at bedtime on October 17 and he is finished     with detox. 3. Ibuprofen 800 mg q.i.d. p.r.n. for pain. 4. Lidoderm Patch, apply to affected area of back for chronic back     pain, up to 12 hours daily. 5. Trazodone 100 mg at bedtime p.r.n. insomnia. 6. He is instructed to discontinue hydrocodone and amitriptyline which     he was previously taking.     Margaret A. Lorin Picket, N.P.   ______________________________ Orson Aloe, MD    MAS/MEDQ  D:  03/31/2011  T:  04/01/2011  Job:  [id number]  Electronically Signed by Kari Baars N.P. on 04/01/2011 03:25:29 PM Electronically Signed by Orson Aloe  on 04/01/2011 04:01:39 PM

## 2011-04-01 NOTE — Assessment & Plan Note (Signed)
NAME:  Craig Davenport, Craig Davenport.:  [id number]  MEDICAL RECORD Davenport.:  [id number]  LOCATION:  0303                          FACILITY:  [facility]  PHYSICIAN:  Craig Aloe, MD       DATE OF BIRTH:  13-Feb-1971  DATE OF ADMISSION:  03/25/2011 DATE OF DISCHARGE:                      PSYCHIATRIC ADMISSION ASSESSMENT   This is a voluntary admission to the services of Dr. Orson Davenport.  HISTORY OF PRESENT ILLNESS:  This is a 40 year old, divorced white male. He presented to the ED at [facility] requesting alcohol detox.  He stated his last drink had been prior to presentation.  His initial alcohol level was 345 and he was positive for barbiturates.  His SGOT was elevated at 150, SGPT at 78, alkaline phosphatases was also elevated at 276.  Apparently, he had been seen at his outpatient clinic, the [facility], and had been told that he will "die from liver disease if he does not come off alcohol."  He is drinking 16 to 18 12 ounce beers a day.  He has been unemployed since 2008.  He says his mother supports him.  PAST PSYCHIATRIC HISTORY:  His first detox was 8 years ago at [facility].  He says that 2 years ago he was detoxed at [facility] so that he could have some more surgery regarding his lymphoma. Now back in '07 it stated that he had already been diagnosed for non- Hodgkin's lymphoma stage I, status post chemotherapy and radiation and resection.  Apparently his original presentation was in the right axilla.  SOCIAL HISTORY:  He has had 1 year of college.  He has been married and divorced twice.  He has 1 son, age 60.  He is unemployed since 2008.  He used to be a Control and instrumentation engineer.  He reports that somehow he hurt his back and hence, he has not been able to be employed.  FAMILY HISTORY:  His maternal grandfather was an alcoholic.  ALCOHOL AND DRUG HISTORY:  He has abused since he was a teenager and now he is dependent.  MEDICAL PROBLEMS:  He had a recent  cholecystectomy and he was also told that he had steatosis.  MEDICATIONS:  He is not prescribed any.  DRUG ALLERGIES:  OXYCODONE GAVE HIM HIVES PENICILLIN HE DOES NOT REMEMBER, AND PHENYTOIN MAKES HIM "GO CRAZY."  POSITIVE PHYSICAL FINDINGS:  His vital signs in the emergency room showed he was afebrile at 97.9 to 98.8, blood pressure was 115/79 to 122/88, pulse ranged from 102 to 122, respirations were 14 to 19. Pertinent labs have already been commented on.  He does have a moderately coarse hand tremor at present.  He denies any history for DTs, withdrawal, seizures or blackouts.  MENTAL STATUS EXAM:  Tonight he is alert and oriented.  He is somewhat unkempt.  He does appear to be detoxing.  His speech was a normal rate, rhythm, and tone.  His mood is anxiously depressed.  Thought processes are somewhat clear, rational, and goal oriented.  He denies a need for any further treatment once detoxed.  He says he is only here for detox. Judgment and insight are poor.  Concentration and memory are superficially  intact.  Intelligence is average.  He denies being suicidal or homicidal.  He denies any auditory or visual hallucinations.  DIAGNOSES:  Axis I:  Alcohol dependence. Axis II:  Deferred. Axis III:  Recent cholecystectomy.  History for non-Hodgkin's lymphoma stage I status post chemo and radiation.  History for back pain. Axis IV:  Poor coping mechanisms.  Chronic abuse of alcohol.  He has had 2 DUIs.  He states that he can get his driver's license back next year. He does not have a PO. Axis V:  35.  PLAN:  The plan is to admit for a medically assisted alcohol detox through use of the low-dose Librium protocol.  He will be re-questioned in the morning regarding his need for antidepressants and/or a longer term substance abuse program.     Craig Davenport, P.A.-C.   ______________________________ Craig Aloe, MD    MD/MEDQ  D:  03/25/2011  T:  03/26/2011  Job:   [id number]  Electronically Signed by Jaci Lazier ADAMS P.A.-C. on 03/30/2011 11:48:56 AM Electronically Signed by Craig Davenport  on 04/01/2011 04:01:32 PM

## 2013-04-29 ENCOUNTER — Encounter (HOSPITAL_COMMUNITY): Payer: Self-pay | Admitting: Emergency Medicine

## 2013-04-29 ENCOUNTER — Emergency Department (HOSPITAL_COMMUNITY): Payer: Self-pay

## 2013-04-29 ENCOUNTER — Observation Stay (HOSPITAL_COMMUNITY)
Admission: EM | Admit: 2013-04-29 | Discharge: 2013-04-30 | Disposition: A | Discharge status: Home / Self Care | Payer: Self-pay | Attending: Internal Medicine | Admitting: Internal Medicine

## 2013-04-29 DIAGNOSIS — R55 Syncope and collapse: Secondary | ICD-10-CM | POA: Insufficient documentation

## 2013-04-29 DIAGNOSIS — R079 Chest pain, unspecified: Principal | ICD-10-CM

## 2013-04-29 DIAGNOSIS — C8589 Other specified types of non-Hodgkin lymphoma, extranodal and solid organ sites: Secondary | ICD-10-CM | POA: Insufficient documentation

## 2013-04-29 DIAGNOSIS — F1010 Alcohol abuse, uncomplicated: Secondary | ICD-10-CM | POA: Insufficient documentation

## 2013-04-29 HISTORY — DX: Non-Hodgkin lymphoma, unspecified, unspecified site: C85.90

## 2013-04-29 HISTORY — DX: Alcohol abuse, uncomplicated: F10.10

## 2013-04-29 LAB — BASIC METABOLIC PANEL
CO2: 25 mEq/L (ref 19–32)
Chloride: 102 mEq/L (ref 96–112)
Creatinine, Ser: 0.92 mg/dL (ref 0.50–1.35)
Potassium: 4.3 mEq/L (ref 3.5–5.1)

## 2013-04-29 LAB — TROPONIN I
Troponin I: <0.3 ng/mL (ref <0.30)
Troponin I: <0.3 ng/mL (ref <0.30)
Troponin I: <0.3 ng/mL (ref <0.30)

## 2013-04-29 LAB — CBC WITH DIFFERENTIAL/PLATELET
Eosinophils Absolute: 0.3 10*3/uL (ref 0.0–0.7)
Eosinophils Relative: 2 % (ref 0–5)
Lymphs Abs: 0.9 10*3/uL (ref 0.7–4.0)
MCH: 30.3 pg (ref 26.0–34.0)
MCV: 87.8 fL (ref 78.0–100.0)
Platelets: 238 10*3/uL (ref 150–400)
RBC: 4.68 MIL/uL (ref 4.22–5.81)
RDW: 13 % (ref 11.5–15.5)

## 2013-04-29 LAB — D-DIMER, QUANTITATIVE: D-Dimer, Quant: 0.98 ug/mL-FEU — ABNORMAL HIGH (ref 0.00–0.48)

## 2013-04-29 LAB — APTT: aPTT: <20 seconds — ABNORMAL LOW (ref 24–37)

## 2013-04-29 MED ORDER — IBUPROFEN 800 MG PO TABS
800.0000 mg | ORAL_TABLET | Freq: Once | ORAL | Status: AC
  Administered 2013-04-29: 800 mg via ORAL
  Filled 2013-04-29: qty 1

## 2013-04-29 MED ORDER — ACETAMINOPHEN 325 MG PO TABS
650.0000 mg | ORAL_TABLET | ORAL | Status: DC | PRN
Start: 2013-04-29 — End: 2013-04-30
  Administered 2013-04-29: 650 mg via ORAL
  Filled 2013-04-29: qty 2

## 2013-04-29 MED ORDER — HEPARIN SODIUM (PORCINE) 5000 UNIT/ML IJ SOLN
5000.0000 [IU] | Freq: Three times a day (TID) | INTRAMUSCULAR | Status: DC
  Administered 2013-04-29 – 2013-04-30 (×2): 5000 [IU] via SUBCUTANEOUS
  Filled 2013-04-29 (×6): qty 1

## 2013-04-29 MED ORDER — ONDANSETRON HCL 4 MG/2ML IJ SOLN: 4.0000 mg | Freq: Four times a day (QID) | INTRAMUSCULAR | Status: DC | PRN

## 2013-04-29 MED ORDER — POLYETHYLENE GLYCOL 3350 17 G PO PACK
17.0000 g | PACK | Freq: Every day | ORAL | Status: DC
  Administered 2013-04-29 – 2013-04-30 (×2): 17 g via ORAL
  Filled 2013-04-29 (×4): qty 1

## 2013-04-29 MED ORDER — IOHEXOL 350 MG/ML SOLN
75.0000 mL | Freq: Once | INTRAVENOUS | Status: AC | PRN
  Administered 2013-04-29: 75 mL via INTRAVENOUS

## 2013-04-29 MED ORDER — FENTANYL CITRATE 0.05 MG/ML IJ SOLN
50.0000 ug | Freq: Once | INTRAMUSCULAR | Status: AC
  Administered 2013-04-29: 50 ug via INTRAVENOUS
  Filled 2013-04-29: qty 2

## 2013-04-29 MED ORDER — ASPIRIN EC 325 MG PO TBEC
325.0000 mg | DELAYED_RELEASE_TABLET | Freq: Every day | ORAL | Status: DC
  Administered 2013-04-29 – 2013-04-30 (×2): 325 mg via ORAL
  Filled 2013-04-29 (×2): qty 1

## 2013-04-29 MED ORDER — SODIUM CHLORIDE 0.9 % IV BOLUS (SEPSIS)
500.0000 mL | Freq: Once | INTRAVENOUS | Status: AC
  Administered 2013-04-29: 500 mL via INTRAVENOUS

## 2013-04-29 MED ORDER — MORPHINE SULFATE 2 MG/ML IJ SOLN
2.0000 mg | INTRAMUSCULAR | Status: DC | PRN
  Administered 2013-04-29: 2 mg via INTRAVENOUS
  Filled 2013-04-29: qty 1

## 2013-04-29 NOTE — ED Notes (Signed)
Pt arrived by [facility] from home. Pt was hunting this morning felt like he was going to pass out along with left sided CP, SOB, and nausea. Pt went home and then had a syncopal episode. EMS administered ASA 324mg , Nitro x2. Pain now 2/10 from previous 9/10. 20g Left hand. [id number]

## 2013-04-29 NOTE — ED Provider Notes (Signed)
CSN: [id number]     Arrival date & time 04/29/13  0907 History   First MD Initiated Contact with Patient 04/29/13 [phone]     Chief Complaint  Patient presents with  . Chest Pain  . Loss of Consciousness   (Consider location/radiation/quality/duration/timing/severity/associated sxs/prior Treatment) Patient is a 42 y.o. male presenting with chest pain and syncope.  Chest Pain Associated symptoms: syncope   Loss of Consciousness Associated symptoms: chest pain    Pt with history of lymphoma, s/o chemo and radiation who is currently in remission for several years. He reports he was hunting this morning when he began to have moderate sharp left sided chest pains. He felt lightheaded with standing and so sat back down with improvement in symptoms. He had a recurrence of his symptoms a few minutes later and so went back to his house where he was having more severe pain associated with SOB and nausea as well as bilateral arm aching. Family reports they called 911 but then he had a syncopal episode. Family at bedside states he was unconscious and not breathing, so at the direction of 911, they moved him to the floor and began CPR. They did not check a pulse. He woke up a short time later and was awake and oriented for EMS. No reported seizure activity. No ACLS meds or shocks given. He was given ASA and NTG x2 with improvement in pain, now 2/10. He has no known history of HTN, DM, HLD or CAD.   Past Medical History  Diagnosis Date  . Lymphoma   . Alcohol abuse    Past Surgical History  Procedure Laterality Date  . Other surgical history      Removal of mass behind eardrum   . Inner ear surgery      Removal of mass behind eardrum  . Back surgery     No family history on file. History  Substance Use Topics  . Smoking status: Never Smoker   . Smokeless tobacco: Not on file  . Alcohol Use: No    Review of Systems  Cardiovascular: Positive for chest pain and syncope.   All other systems  reviewed and are negative except as noted in HPI.   Allergies  Oxycodone  Home Medications  No current outpatient prescriptions on file. BP 101/54  Pulse 72  Temp(Src) 98.2 F (36.8 C) (Oral)  Resp 17  Ht 5\' 4"  (1.626 m)  Wt 174 lb (78.926 kg)  BMI 29.85 kg/m2  SpO2 99% Physical Exam  Nursing note and vitals reviewed. Constitutional: He is oriented to person, place, and time. He appears well-developed and well-nourished.  HENT:  Head: Normocephalic and atraumatic.  Eyes: EOM are normal. Pupils are equal, round, and reactive to light.  Neck: Normal range of motion. Neck supple.  Cardiovascular: Normal rate, normal heart sounds and intact distal pulses.   Pulmonary/Chest: Effort normal and breath sounds normal. No respiratory distress. He has no wheezes. He has no rales. He exhibits tenderness (L parasternal).  Abdominal: Bowel sounds are normal. He exhibits no distension. There is no tenderness. There is no rebound and no guarding.  Musculoskeletal: Normal range of motion. He exhibits no edema and no tenderness.  Neurological: He is alert and oriented to person, place, and time. He has normal strength. No cranial nerve deficit or sensory deficit.  Skin: Skin is warm and dry. No rash noted.  Psychiatric: He has a normal mood and affect.    ED Course  Procedures (including critical care time)  Labs Review Labs Reviewed  CBC WITH DIFFERENTIAL  TROPONIN I  PRO B NATRIURETIC PEPTIDE  PROTIME-INR  APTT  BASIC METABOLIC PANEL  D-DIMER, QUANTITATIVE   Imaging Review No results found.  EKG Interpretation     Ventricular Rate:  73 PR Interval:  138 QRS Duration: 78 QT Interval:  379 QTC Calculation: 418 R Axis:   51 Text Interpretation:  Sinus rhythm ST elev, probable normal early repol pattern No significant change since last tracing            MDM   1. Chest pain   2. Syncope     Pt with neg labs except for elevated dimer, sent for CTA which was neg.  Admit for further evaluation of CP and ?syncope    Felcia Huebert B. Bernette Mayers, MD 04/30/13 [phone]

## 2013-04-29 NOTE — H&P (Signed)
Date: 04/29/2013               Patient Name:  Craig Davenport MRN: [id number]  DOB: Nov 19, 1970 Age / Sex: 42 y.o., male   PCP: No primary provider on file.         Medical Service: Internal Medicine Teaching Service         Attending Physician: Dr. Inez Catalina, MD    First Contact: Dr. Evelena Peat Pager: [phone]  Second Contact: Dr. Christen Bame Pager: [phone]       After Hours (After 5p/  First Contact Pager: [phone]  weekends / holidays): Second Contact Pager: [phone]   Chief Complaint:  Chest pain and syncope  History of Present Illness: [name] is a 42 year old male with PMH of lymphoma in remission s/p chemo and XRT (~ 10 years ago) who presented to the ED with complaint of CP.  He was out deer hunting with his son this morning.  He attempted to get up from where he was seated but felt dizzy and nauseous so he sat back down.  He attempted to stand up about three times but each time needed to sit back down.  At that point he began to experience SOB and pleuritic CP he describes as 8/10, sharp and radiating into his L arm and hand.  He was able to get up and he began walking towards his home along with his son.  He says he had stop about three or four times to rest while walking toward his home.  He says he felt weak, as if his blood pressure was low.  The CP continued and once he got back to the house he sat down.  The last thing he remembers after that is being surrounded by EMS.   Per ED note, the family reports the patient syncopized and was not breathing.  They began CPR at the direction of 911 operator.  He was alert and oriented for EMS and had no seizure activity.  He was given SL nitro twice and four 81mg  ASA in route to the ED which improved his pain to 2/10.  In the ED:  T 98.14F, RR 17, SpO2 99%, HR 72, BP 101/54; fentanyl , NSS bolus, Miralax 17g given.  Meds: Current Facility-Administered Medications  Medication Dose Route Frequency Provider Last Rate  Last Dose  . polyethylene glycol (MIRALAX / GLYCOLAX) packet 17 g  17 g Oral Daily Christen Bame, MD        Allergies: Allergies as of 04/29/2013 - Review Complete 04/29/2013  Allergen Reaction Noted  . Oxycodone Hives 04/29/2013   Past Medical History  Diagnosis Date  . Lymphoma   . Alcohol abuse    Past Surgical History  Procedure Laterality Date  . Other surgical history      Removal of mass behind eardrum   . Inner ear surgery      Removal of mass behind eardrum  . Back surgery     No family history on file. History   Social History  . Marital Status: Divorced    Spouse Name: N/A    Number of Children: N/A  . Years of Education: N/A   Occupational History  . Not on file.   Social History Main Topics  . Smoking status: Never Smoker   . Smokeless tobacco: Not on file  . Alcohol Use: No  . Drug Use: No  . Sexual Activity: Not on file   Other Topics Concern  .  Not on file   Social History Narrative  . No narrative on file    Review of Systems: Pertinent items are noted in HPI.  Physical Exam: Blood pressure 112/62, pulse 78, temperature 98.2 F (36.8 C), temperature source Oral, resp. rate 18, height 5\' 4"  (1.626 m), weight 74.9 kg (165 lb 2 oz), SpO2 98.00%. General: resting in bed in NAD HEENT: PERRL, EOMI, no scleral icterus Cardiac: RRR, no rubs, murmurs or gallops Pulm: clear to auscultation bilaterally, moving normal volumes of air Abd: soft, nontender, nondistended, BS present Ext: warm and well perfused, no pedal edema Neuro: alert and oriented X3, cranial nerves II-XII grossly intact  Lab results: Basic Metabolic Panel:  Recent Labs  09/81/19 0950  NA 137  K 4.3  CL 102  CO2 25  GLUCOSE 103*  BUN 12  CREATININE 0.92  CALCIUM 9.1   CBC:  Recent Labs  04/29/13 0950  WBC 11.5*  NEUTROABS 9.3*  HGB 14.2  HCT 41.1  MCV 87.8  PLT 238   Cardiac Enzymes:  Recent Labs  04/29/13 0950  TROPONINI <0.30   BNP:  Recent Labs   04/29/13 0950  PROBNP 17.7   D-Dimer:  Recent Labs  04/29/13 0950  DDIMER 0.98*   Coagulation:  Recent Labs  04/29/13 0950  LABPROT 12.6  INR 0.96   Urine Drug Screen: Drugs of Abuse     Component Value Date/Time   LABOPIA NONE DETECTED 03/24/2011 2227   COCAINSCRNUR NONE DETECTED 03/24/2011 2227   LABBENZ NONE DETECTED 03/24/2011 2227   AMPHETMU NONE DETECTED 03/24/2011 2227   THCU NONE DETECTED 03/24/2011 2227   LABBARB POSITIVE* 03/24/2011 2227    Imaging results:  Dg Chest 2 View  04/29/2013   CLINICAL DATA:  Syncope.  EXAM: CHEST  2 VIEW  COMPARISON:  03/07/2011  FINDINGS: Heart, mediastinum and hila are unremarkable. The lungs are clear. No pleural effusion or pneumothorax. There are vascular clips along the right axilla, stable. The bony thorax is intact.  IMPRESSION: No active cardiopulmonary disease.   Electronically Signed   By: Amie Portland M.D.   On: 04/29/2013 10:10   Ct Angio Chest Pe W/cm &/or Wo Cm  04/29/2013   CLINICAL DATA:  Left-sided chest pain, shortness of breath and nausea. History of lymphoma and back surgery.  EXAM: CT ANGIOGRAPHY CHEST WITH CONTRAST  TECHNIQUE: Multidetector CT imaging of the chest was performed using the standard protocol during bolus administration of intravenous contrast. Multiplanar CT image reconstructions including MIPs were obtained to evaluate the vascular anatomy.  CONTRAST:  75mL OMNIPAQUE IOHEXOL 350 MG/ML SOLN  COMPARISON:  Current chest radiograph.  Chest CT, 09/21/2006  FINDINGS: No evidence of a pulmonary embolism.  Heart is normal in size. Great vessels are normal in caliber. No aortic dissection.  No mediastinal or hilar masses. No pathologically enlarged lymph nodes.  Fine reticular scarring in the right upper lobe is stable from the prior exam. There are additional mild areas of reticular opacity that are most likely subsegmental atelectasis. The lungs are otherwise clear.  No pleural effusion or pneumothorax.  Limited  evaluation of the upper abdomen shows changes from a cholecystectomy, but is otherwise unremarkable.  Minor degenerative changes are noted of the thoracic spine. No osteoblastic or osteolytic lesions.  Review of the MIP images confirms the above findings.  IMPRESSION: 1. No evidence of a pulmonary embolism. 2. No acute findings.   Electronically Signed   By: Amie Portland M.D.   On: 04/29/2013 11:32  Other results: EKG: NSR, ST elevation in lead 2.  Assessment & Plan by Problem: 42 year old male with PMH of lymphoma in remission s/p chemo and XRT (~ 10 years ago) who presented to the ED with complaint of CP and possible syncope.  #Chest pain rule out ACS: 42 year old male with CP relieved by rest and nitro.  No HTN or DM.  No personal or family history of MI.  Says he has been told his cholesterol is elevated and has tried diet control.  ST elevation in one lead only, Istat trop negative making ACS less likely.  Pleuritic CP and SOB but CTA negative for PE.  CXR negative.  Patient says his appetite has not been great.  WBC elevated to 11.5, no flu shot this year.  He denies fevers/chills, but leukocytosis with increased neutrophil predominance points to potential infectious etiology. - admit to IMTS on telemetry   - CE x 3 - ASA, morpine, zofran - repeat orthostatics - AM EKG - lipid panel  #Diet: Heart   #DVT ppx: [place] heparin  Dispo: Disposition is deferred at this time, awaiting improvement of current medical problems. Anticipated discharge in approximately 1-2 day(s).   The patient does not have a current PCP (No primary provider on file.) and does need an [facility] hospital follow-up appointment after discharge.  The patient does not know have transportation limitations that hinder transportation to clinic appointments.  Signed: Evelena Peat, DO 04/29/2013, 2:28 PM

## 2013-04-30 DIAGNOSIS — R079 Chest pain, unspecified: Secondary | ICD-10-CM

## 2013-04-30 LAB — BASIC METABOLIC PANEL
CO2: 24 mEq/L (ref 19–32)
Chloride: 104 mEq/L (ref 96–112)
GFR calc Af Amer: >90 mL/min (ref >90)
GFR calc non Af Amer: >90 mL/min (ref >90)
Potassium: 4.2 mEq/L (ref 3.5–5.1)
Sodium: 139 mEq/L (ref 135–145)

## 2013-04-30 LAB — LIPID PANEL
Cholesterol: 130 mg/dL (ref 0–200)
HDL: 37 mg/dL — ABNORMAL LOW (ref >39)
LDL Cholesterol: 80 mg/dL (ref 0–99)
Total CHOL/HDL Ratio: 3.5 RATIO
Triglycerides: 65 mg/dL (ref <150)

## 2013-04-30 LAB — CBC WITH DIFFERENTIAL/PLATELET
Basophils Relative: 1 % (ref 0–1)
Eosinophils Absolute: 0.5 10*3/uL (ref 0.0–0.7)
HCT: 41.3 % (ref 39.0–52.0)
Hemoglobin: 13.9 g/dL (ref 13.0–17.0)
Lymphocytes Relative: 22 % (ref 12–46)
Monocytes Absolute: 1 10*3/uL (ref 0.1–1.0)
Monocytes Relative: 13 % — ABNORMAL HIGH (ref 3–12)
Neutro Abs: 4.3 10*3/uL (ref 1.7–7.7)
Neutrophils Relative %: 58 % (ref 43–77)
RBC: 4.58 MIL/uL (ref 4.22–5.81)
WBC: 7.5 10*3/uL (ref 4.0–10.5)

## 2013-04-30 MED ORDER — MENTHOL 3 MG MT LOZG
1.0000 | LOZENGE | OROMUCOSAL | Status: DC | PRN
  Filled 2013-04-30: qty 9

## 2013-04-30 NOTE — Progress Notes (Signed)
Subjective: Mr. Prather was seen and examined this morning.  His left CP has resolved and he denies SOB.  His appetite is good.  He reports sore throat and runny nose.  Objective: Vital signs in last 24 hours: Filed Vitals:   04/30/13 0600 04/30/13 0601 04/30/13 0603 04/30/13 0606  BP: 98/53 104/69 95/66 92/63   Pulse: 69 68 82 84  Temp: 98.5 F (36.9 C)     TempSrc: Oral     Resp: 18 18    Height:      Weight:      SpO2: 97%      Weight change:   Intake/Output Summary (Last 24 hours) at 04/30/13 0912 Last data filed at 04/30/13 0900  Gross per 24 hour  Intake    600 ml  Output      0 ml  Net    600 ml   General: resting in bed Cardiac: RRR, no rubs, murmurs or gallops Pulm: clear to auscultation bilaterally, moving normal volumes of air Abd: soft, nontender, nondistended, BS present Ext: warm and well perfused, no pedal edema Neuro: alert and oriented X3  Lab Results: Basic Metabolic Panel:  Recent Labs Lab 04/29/13 0950 04/30/13 0425  NA 137 139  K 4.3 4.2  CL 102 104  CO2 25 24  GLUCOSE 103* 102*  BUN 12 11  CREATININE 0.92 0.88  CALCIUM 9.1 9.1   CBC:  Recent Labs Lab 04/29/13 0950 04/30/13 0425  WBC 11.5* 7.5  NEUTROABS 9.3* 4.3  HGB 14.2 13.9  HCT 41.1 41.3  MCV 87.8 90.2  PLT 238 220   Cardiac Enzymes:  Recent Labs Lab 04/29/13 0950 04/29/13 1420 04/29/13 2003  TROPONINI <0.30 <0.30 <0.30   BNP:  Recent Labs Lab 04/29/13 0950  PROBNP 17.7   D-Dimer:  Recent Labs Lab 04/29/13 0950  DDIMER 0.98*   Fasting Lipid Panel:  Recent Labs Lab 04/30/13 0425  CHOL 130  HDL 37*  LDLCALC 80  TRIG 65  CHOLHDL 3.5   Coagulation:  Recent Labs Lab 04/29/13 0950  LABPROT 12.6  INR 0.96   Urine Drug Screen: Drugs of Abuse     Component Value Date/Time   LABOPIA NONE DETECTED 03/24/2011 2227   COCAINSCRNUR NONE DETECTED 03/24/2011 2227   LABBENZ NONE DETECTED 03/24/2011 2227   AMPHETMU NONE DETECTED 03/24/2011 2227   THCU NONE DETECTED 03/24/2011 2227   LABBARB POSITIVE* 03/24/2011 2227    Alcohol Level:  Recent Labs Lab 04/29/13 1420  ETH <11   Studies/Results: Dg Chest 2 View  04/29/2013   CLINICAL DATA:  Syncope.  EXAM: CHEST  2 VIEW  COMPARISON:  03/07/2011  FINDINGS: Heart, mediastinum and hila are unremarkable. The lungs are clear. No pleural effusion or pneumothorax. There are vascular clips along the right axilla, stable. The bony thorax is intact.  IMPRESSION: No active cardiopulmonary disease.   Electronically Signed   By: Amie Portland M.D.   On: 04/29/2013 10:10   Ct Angio Chest Pe W/cm &/or Wo Cm  04/29/2013   CLINICAL DATA:  Left-sided chest pain, shortness of breath and nausea. History of lymphoma and back surgery.  EXAM: CT ANGIOGRAPHY CHEST WITH CONTRAST  TECHNIQUE: Multidetector CT imaging of the chest was performed using the standard protocol during bolus administration of intravenous contrast. Multiplanar CT image reconstructions including MIPs were obtained to evaluate the vascular anatomy.  CONTRAST:  75mL OMNIPAQUE IOHEXOL 350 MG/ML SOLN  COMPARISON:  Current chest radiograph.  Chest CT, 09/21/2006  FINDINGS: No  evidence of a pulmonary embolism.  Heart is normal in size. Great vessels are normal in caliber. No aortic dissection.  No mediastinal or hilar masses. No pathologically enlarged lymph nodes.  Fine reticular scarring in the right upper lobe is stable from the prior exam. There are additional mild areas of reticular opacity that are most likely subsegmental atelectasis. The lungs are otherwise clear.  No pleural effusion or pneumothorax.  Limited evaluation of the upper abdomen shows changes from a cholecystectomy, but is otherwise unremarkable.  Minor degenerative changes are noted of the thoracic spine. No osteoblastic or osteolytic lesions.  Review of the MIP images confirms the above findings.  IMPRESSION: 1. No evidence of a pulmonary embolism. 2. No acute findings.    Electronically Signed   By: Amie Portland M.D.   On: 04/29/2013 11:32   Medications: I have reviewed the patient's current medications. Scheduled Meds: . aspirin EC  325 mg Oral Daily  . heparin  5,000 Units Subcutaneous Q8H  . 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   #Chest pain rule out ACS:  42 year old male with CP relieved by rest and nitro. No HTN or DM. No personal or family history of MI. Says he has been told his cholesterol is elevated and has tried diet control. ST elevation in one lead only, Istat trop negative making ACS less likely. Pleuritic CP and SOB but CTA negative for PE. CXR negative. Patient says his appetite has not been great. WBC elevated to 11.5, no flu shot this year. He denies fevers/chills, but leukocytosis with increased neutrophil predominance points to potential infectious etiology.  CE negative x 3, patient feeling better this morning and left CP has resolved.  He now has complaint of sore throat and runny nose so this is likely the start of viral URI.  Lipid panel normal except low HDL.  - ACS ruled out - Will arrange outpatient follow-up with PCP, Dr. Brent Bulla  - Will need outpatient cardiac follow-up for stress test  #Diet:  Heart   #DVT ppx:  [place] heparin  Dispo:  Patient pain resolved and feeling well.  Stable for discharge today.  Will arrange follow-up with PCP.  Will need referral from PCP to cardiology for stress test.  The patient does have a current PCP (No primary provider on file.) and does need an [facility] hospital follow-up appointment after discharge.  The patient does not know have transportation limitations that hinder transportation to clinic appointments.  .Services Needed at  time of discharge: Y = Yes, Blank = No PT:   OT:   RN:   Equipment:   Other:     LOS: 1 day   Evelena Peat, DO 04/30/2013, 9:12 AM

## 2013-04-30 NOTE — Discharge Summary (Signed)
Patient Name:  Craig Davenport  MRN: [id number]  PCP: No primary provider on file.  DOB:  [birth year]       Date of Admission:  04/29/2013  Date of Discharge:  04/30/2013      Attending Physician: Dr. Inez Catalina, MD         DISCHARGE DIAGNOSES: 1.   Chest pain   DISPOSITION AND FOLLOW-UP: Craig Davenport is to follow-up with the listed providers as detailed below, at patient's visiting, please address following issues:  1) Referral to cardiology for stress test.  Follow-up Information   Follow up with Abigail Miyamoto, MD.   Specialty:  Family Medicine   Contact information:   [phone] [address] [place] [address] [phone]      Discharge Orders   Future Orders Complete By Expires   Call MD for:  difficulty breathing, headache or visual disturbances  As directed    Call MD for:  extreme fatigue  As directed    Call MD for:  persistant dizziness or light-headedness  As directed    Call MD for:  persistant nausea and vomiting  As directed    Call MD for:  severe uncontrolled pain  As directed    Call MD for:  temperature >100.4  As directed    Diet - low sodium heart healthy  As directed    Increase activity slowly  As directed        DISCHARGE MEDICATIONS:   Medication List    Notice   You have not been prescribed any medications.       CONSULTS:    None   PROCEDURES PERFORMED:  Dg Chest 2 View  04/29/2013   CLINICAL DATA:  Syncope.  EXAM: CHEST  2 VIEW  COMPARISON:  03/07/2011  FINDINGS: Heart, mediastinum and hila are unremarkable. The lungs are clear. No pleural effusion or pneumothorax. There are vascular clips along the right axilla, stable. The bony thorax is intact.  IMPRESSION: No active cardiopulmonary disease.   Electronically Signed   By: Amie Portland M.D.   On: 04/29/2013 10:10   Ct Angio Chest Pe W/cm &/or Wo Cm  04/29/2013   CLINICAL DATA:  Left-sided chest pain, shortness of breath and nausea. History of lymphoma and  back surgery.  EXAM: CT ANGIOGRAPHY CHEST WITH CONTRAST  TECHNIQUE: Multidetector CT imaging of the chest was performed using the standard protocol during bolus administration of intravenous contrast. Multiplanar CT image reconstructions including MIPs were obtained to evaluate the vascular anatomy.  CONTRAST:  75mL OMNIPAQUE IOHEXOL 350 MG/ML SOLN  COMPARISON:  Current chest radiograph.  Chest CT, 09/21/2006  FINDINGS: No evidence of a pulmonary embolism.  Heart is normal in size. Great vessels are normal in caliber. No aortic dissection.  No mediastinal or hilar masses. No pathologically enlarged lymph nodes.  Fine reticular scarring in the right upper lobe is stable from the prior exam. There are additional mild areas of reticular opacity that are most likely subsegmental atelectasis. The lungs are otherwise clear.  No pleural effusion or pneumothorax.  Limited evaluation of the upper abdomen shows changes from a cholecystectomy, but is otherwise unremarkable.  Minor degenerative changes are noted of the thoracic spine. No osteoblastic or osteolytic lesions.  Review of the MIP images confirms the above findings.  IMPRESSION: 1. No evidence of a pulmonary embolism. 2. No acute findings.   Electronically Signed   By: Amie Portland M.D.   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 He was out deer hunting with his son this morning. He attempted to get up from where he was seated but felt dizzy and nauseous so he sat back down. He attempted to stand up about three times but each time needed to sit back down. At that point he began to experience SOB and pleuritic CP he describes as 8/10, sharp and radiating into his L arm and hand. He was able to get up and he began walking towards his home along with his son. He says he had stop about three or four times to  rest while walking toward his home. He says he felt weak, as if his blood pressure was low. The CP continued and once he got back to the house he sat down. The last thing he remembers after that is being surrounded by EMS. Per ED note, the family reports the patient syncopized and was not breathing. They began CPR at the direction of 911 operator. He was alert and oriented for EMS and had no seizure activity. He was given SL nitro twice and four 81mg  ASA in route to the ED which improved his pain to 2/10.  In the ED: T 98.22F, RR 17, SpO2 99%, HR 72, BP 101/54; fentanyl , NSS bolus, Miralax 17g given.  Physical Exam: Blood pressure 112/62, pulse 78, temperature 98.2 F (36.8 C), temperature source Oral, resp. rate 18, height 5\' 4"  (1.626 m), weight 74.9 kg (165 lb 2 oz), SpO2 98.00%.  General: resting in bed in NAD  HEENT: PERRL, EOMI, no scleral icterus  Cardiac: RRR, no rubs, murmurs or gallops  Pulm: clear to auscultation bilaterally, moving normal volumes of air  Abd: soft, nontender, nondistended, BS present  Ext: warm and well perfused, no pedal edema  Neuro: alert and oriented X3, cranial nerves II-XII grossly intact  Labs: Basic Metabolic Panel:   Recent Labs   04/29/13 0950   NA  137   K  4.3   CL  102   CO2  25   GLUCOSE  103*   BUN  12   CREATININE  0.92   CALCIUM  9.1    CBC:   Recent Labs   04/29/13 0950   WBC  11.5*   NEUTROABS  9.3*   HGB  14.2   HCT  41.1   MCV  87.8   PLT  238    Cardiac Enzymes:   Recent Labs   04/29/13 0950   TROPONINI  <0.30    BNP:   Recent Labs   04/29/13 0950   PROBNP  17.7    D-Dimer:   Recent Labs   04/29/13 0950   DDIMER  0.98*    Coagulation:   Recent Labs   04/29/13 0950   LABPROT  12.6   INR  0.96    HOSPITAL COURSE: Chest pain rule out ACS:  Craig Davenport was admitted and worked-up for possible causes of his chest pain.  ACS work-up was negative as cardiac enzymes were negative x 3, EKG was  not concerning for ischemia (minimal ST elevation in one Lead).  PE ruled out by negative CTA.  His CXR was negative for acute cardiopulmonary disease.  He reported decreased appetite, he had not had a flu shot this year, he denied fevers/chills but his  leukocytosis with increased neutrophil predominance pointed to potential infectious etiology.  On hospital day two he had no left chest pain or SOB and reported good appetite.  He did report sore throat and runny nose that day, consistent with the start of a URI.  He was stable for discharge that day with plan to follow-up with his PCP for referral for outpatient stress test.  DISCHARGE DATA: Vital Signs: BP 92/63  Pulse 84  Temp(Src) 98.5 F (36.9 C) (Oral)  Resp 18  Ht 5\' 4"  (1.626 m)  Wt 74.9 kg (165 lb 2 oz)  BMI 28.33 kg/m2  SpO2 97%  Labs: Results for orders placed during the hospital encounter of 04/29/13 (from the past 24 hour(s))  ETHANOL     Status: None   Collection Time    04/29/13  2:20 PM      Result Value Range   Alcohol, Ethyl (B) <11  0 - 11 mg/dL  TROPONIN I     Status: None   Collection Time    04/29/13  2:20 PM      Result Value Range   Troponin I <0.30  <0.30 ng/mL  TROPONIN I     Status: None   Collection Time    04/29/13  8:03 PM      Result Value Range   Troponin I <0.30  <0.30 ng/mL  BASIC METABOLIC PANEL     Status: Abnormal   Collection Time    04/30/13  4:25 AM      Result Value Range   Sodium 139  135 - 145 mEq/L   Potassium 4.2  3.5 - 5.1 mEq/L   Chloride 104  96 - 112 mEq/L   CO2 24  19 - 32 mEq/L   Glucose, Bld 102 (*) 70 - 99 mg/dL   BUN 11  6 - 23 mg/dL   Creatinine, Ser 1.61  0.50 - 1.35 mg/dL   Calcium 9.1  8.4 - 09.6 mg/dL   GFR calc non Af Amer >90  >90 mL/min   GFR calc Af Amer >90  >90 mL/min  CBC WITH DIFFERENTIAL     Status: Abnormal   Collection Time    04/30/13  4:25 AM      Result Value Range   WBC 7.5  4.0 - 10.5 K/uL   RBC 4.58  4.22 - 5.81 MIL/uL   Hemoglobin 13.9  13.0  - 17.0 g/dL   HCT 04.5  40.9 - 81.1 %   MCV 90.2  78.0 - 100.0 fL   MCH 30.3  26.0 - 34.0 pg   MCHC 33.7  30.0 - 36.0 g/dL   RDW 91.4  78.2 - 95.6 %   Platelets 220  150 - 400 K/uL   Neutrophils Relative % 58  43 - 77 %   Neutro Abs 4.3  1.7 - 7.7 K/uL   Lymphocytes Relative 22  12 - 46 %   Lymphs Abs 1.6  0.7 - 4.0 K/uL   Monocytes Relative 13 (*) 3 - 12 %   Monocytes Absolute 1.0  0.1 - 1.0 K/uL   Eosinophils Relative 7 (*) 0 - 5 %   Eosinophils Absolute 0.5  0.0 - 0.7 K/uL   Basophils Relative 1  0 - 1 %   Basophils Absolute 0.1  0.0 - 0.1 K/uL  LIPID PANEL     Status: Abnormal   Collection Time    04/30/13  4:25 AM      Result Value Range  Cholesterol 130  0 - 200 mg/dL   Triglycerides 65  <478 mg/dL   HDL 37 (*) >29 mg/dL   Total CHOL/HDL Ratio 3.5     VLDL 13  0 - 40 mg/dL   LDL Cholesterol 80  0 - 99 mg/dL     Services Ordered on Discharge: Y = Yes; Blank = No PT:   OT:   RN:   Equipment:   Other:      Time Spent on Discharge: 35 min   Signed: Evelena Peat PGY 1, Internal Medicine Resident 04/30/2013, 11:38 AM

## 2013-04-30 NOTE — H&P (Signed)
  Date: 04/30/2013  Patient name: Craig Davenport  Medical record number: [id number]  Date of birth: 24-Jul-1970   I have seen and evaluated Lennon Alstrom and discussed their care with the Residency Team.  Mr. Wilbourne is a 42yo man with PMH of lymphoma in remission with XRT about 10 years ago.  He presents to the ED with CP and near syncope.  He reported while out hunting, he attempted to get up from a seated position when he became dizzy and nauseated and sat down.  This happened a three times and then he developed SOB and chest pain, left sided, radiating down his left arm to his hand.  The pain was worse with inspiration and reproducible.  Further associated symptoms included weakness and feeling like his BP was low.  Per chart review, once arriving home he past out and was not breathing.  The next thing he remember is being surrounded by EMS.  When I evaluated him his pain had resolved to a dull ache in his left chest which was reproducible.  He has no personal or family history of CAD. He has a TIMI risk score of 0.   Assessment and Plan: I have seen and evaluated the patient as outlined above. I agree with the formulated Assessment and Plan as detailed in the residents' admission note, with the following changes:   1. CP, ACS rule out - CT of the chest as noted in resident note showed no PE or atypical findings in the chest.  - CE will be cycled, initially negative - AM EKG, first showed ST elevation (minimal) in only 1 lead, otherwise NSR - lipid panel - Asa, nitro, zofran - Orthostatics - If rules out, can likely be discharged with outpatient stress/exercise test planned.   2. H/O lymphoma - No acute issue, CT chest showed no concerning changes  Other issues per resident note.   Inez Catalina, MD 11/16/201410:51 AM

## 2013-04-30 NOTE — Progress Notes (Signed)
Utilization Review completed.  

## 2013-05-04 NOTE — Discharge Summary (Signed)
I saw Craig Davenport on day of discharge and assisted in the discharge planning.

## 2014-09-11 ENCOUNTER — Encounter (HOSPITAL_BASED_OUTPATIENT_CLINIC_OR_DEPARTMENT_OTHER): Payer: Self-pay | Admitting: *Deleted

## 2014-09-11 ENCOUNTER — Other Ambulatory Visit: Payer: Self-pay | Admitting: Orthopedic Surgery

## 2014-09-12 ENCOUNTER — Encounter (HOSPITAL_BASED_OUTPATIENT_CLINIC_OR_DEPARTMENT_OTHER): Payer: Self-pay | Admitting: *Deleted

## 2014-09-12 NOTE — Progress Notes (Signed)
No labs needed-has been free alcohol 3 yr

## 2014-09-18 ENCOUNTER — Ambulatory Visit (HOSPITAL_BASED_OUTPATIENT_CLINIC_OR_DEPARTMENT_OTHER): Payer: Worker's Compensation | Admitting: Certified Registered"

## 2014-09-18 ENCOUNTER — Encounter (HOSPITAL_BASED_OUTPATIENT_CLINIC_OR_DEPARTMENT_OTHER): Payer: Self-pay | Admitting: Orthopedic Surgery

## 2014-09-18 ENCOUNTER — Encounter (HOSPITAL_BASED_OUTPATIENT_CLINIC_OR_DEPARTMENT_OTHER)
Admission: RE | Disposition: A | Discharge status: Home / Self Care | Payer: Self-pay | Source: Ambulatory Visit | Attending: Orthopedic Surgery

## 2014-09-18 ENCOUNTER — Ambulatory Visit (HOSPITAL_BASED_OUTPATIENT_CLINIC_OR_DEPARTMENT_OTHER)
Admission: RE | Admit: 2014-09-18 | Discharge: 2014-09-18 | Disposition: A | Discharge status: Home / Self Care | Payer: Worker's Compensation | Source: Ambulatory Visit | Attending: Orthopedic Surgery | Admitting: Orthopedic Surgery

## 2014-09-18 DIAGNOSIS — Z888 Allergy status to other drugs, medicaments and biological substances status: Secondary | ICD-10-CM | POA: Diagnosis not present

## 2014-09-18 DIAGNOSIS — M13842 Other specified arthritis, left hand: Secondary | ICD-10-CM | POA: Insufficient documentation

## 2014-09-18 DIAGNOSIS — F17200 Nicotine dependence, unspecified, uncomplicated: Secondary | ICD-10-CM | POA: Insufficient documentation

## 2014-09-18 DIAGNOSIS — X58XXXA Exposure to other specified factors, initial encounter: Secondary | ICD-10-CM | POA: Diagnosis not present

## 2014-09-18 DIAGNOSIS — S63642A Sprain of metacarpophalangeal joint of left thumb, initial encounter: Secondary | ICD-10-CM | POA: Insufficient documentation

## 2014-09-18 DIAGNOSIS — Z9049 Acquired absence of other specified parts of digestive tract: Secondary | ICD-10-CM | POA: Insufficient documentation

## 2014-09-18 HISTORY — PX: FINGER ARTHRODESIS: SHX5000

## 2014-09-18 LAB — POCT HEMOGLOBIN-HEMACUE: Hemoglobin: 14.8 g/dL (ref 13.0–17.0)

## 2014-09-18 SURGERY — FUSION, JOINT, FINGER
Anesthesia: General | Site: Thumb | Laterality: Right

## 2014-09-18 MED ORDER — FENTANYL CITRATE 0.05 MG/ML IJ SOLN
50.0000 ug | INTRAMUSCULAR | Status: DC | PRN
  Administered 2014-09-18: 100 ug via INTRAVENOUS

## 2014-09-18 MED ORDER — PROMETHAZINE HCL 25 MG/ML IJ SOLN: 6.2500 mg | INTRAMUSCULAR | Status: DC | PRN

## 2014-09-18 MED ORDER — MIDAZOLAM HCL 5 MG/5ML IJ SOLN
INTRAMUSCULAR | Status: DC | PRN
  Administered 2014-09-18: 2 mg via INTRAVENOUS

## 2014-09-18 MED ORDER — FENTANYL CITRATE 0.05 MG/ML IJ SOLN
INTRAMUSCULAR | Status: AC
  Filled 2014-09-18: qty 6

## 2014-09-18 MED ORDER — MIDAZOLAM HCL 2 MG/2ML IJ SOLN
INTRAMUSCULAR | Status: AC
  Filled 2014-09-18: qty 2

## 2014-09-18 MED ORDER — IBUPROFEN 200 MG PO TABS
ORAL_TABLET | ORAL | Status: AC
Start: 2014-09-18 — End: 2014-09-18
  Filled 2014-09-18: qty 4

## 2014-09-18 MED ORDER — FENTANYL CITRATE 0.05 MG/ML IJ SOLN
INTRAMUSCULAR | Status: DC | PRN
  Administered 2014-09-18: 50 ug via INTRAVENOUS

## 2014-09-18 MED ORDER — CEFAZOLIN SODIUM-DEXTROSE 2-3 GM-% IV SOLR: 2.0000 g | INTRAVENOUS | Status: DC

## 2014-09-18 MED ORDER — DEXAMETHASONE SODIUM PHOSPHATE 10 MG/ML IJ SOLN
INTRAMUSCULAR | Status: DC | PRN
  Administered 2014-09-18: 10 mg via INTRAVENOUS

## 2014-09-18 MED ORDER — OXYCODONE HCL 5 MG PO TABS: 5.0000 mg | ORAL_TABLET | Freq: Once | ORAL | Status: DC | PRN

## 2014-09-18 MED ORDER — LIDOCAINE HCL (CARDIAC) 20 MG/ML IV SOLN
INTRAVENOUS | Status: DC | PRN
  Administered 2014-09-18: 30 mg via INTRAVENOUS

## 2014-09-18 MED ORDER — ROPIVACAINE HCL 5 MG/ML IJ SOLN
INTRAMUSCULAR | Status: DC | PRN
  Administered 2014-09-18: 30 mL via PERINEURAL

## 2014-09-18 MED ORDER — HYDROMORPHONE HCL 1 MG/ML IJ SOLN
INTRAMUSCULAR | Status: AC
  Filled 2014-09-18: qty 1

## 2014-09-18 MED ORDER — BUPIVACAINE HCL (PF) 0.25 % IJ SOLN
INTRAMUSCULAR | Status: DC | PRN
  Administered 2014-09-18: 10 mL

## 2014-09-18 MED ORDER — FENTANYL CITRATE 0.05 MG/ML IJ SOLN
INTRAMUSCULAR | Status: AC
  Filled 2014-09-18: qty 2

## 2014-09-18 MED ORDER — CHLORHEXIDINE GLUCONATE 4 % EX LIQD: 60.0000 mL | Freq: Once | CUTANEOUS | Status: DC

## 2014-09-18 MED ORDER — MIDAZOLAM HCL 2 MG/2ML IJ SOLN
1.0000 mg | INTRAMUSCULAR | Status: DC | PRN
  Administered 2014-09-18: 2 mg via INTRAVENOUS

## 2014-09-18 MED ORDER — ONDANSETRON HCL 4 MG/2ML IJ SOLN
INTRAMUSCULAR | Status: DC | PRN
  Administered 2014-09-18: 4 mg via INTRAVENOUS

## 2014-09-18 MED ORDER — LACTATED RINGERS IV SOLN
INTRAVENOUS | Status: DC
  Administered 2014-09-18 (×2): via INTRAVENOUS

## 2014-09-18 MED ORDER — HYDROMORPHONE HCL 1 MG/ML IJ SOLN
0.2500 mg | INTRAMUSCULAR | Status: DC | PRN
  Administered 2014-09-18 (×4): 0.5 mg via INTRAVENOUS

## 2014-09-18 MED ORDER — CEFAZOLIN SODIUM-DEXTROSE 2-3 GM-% IV SOLR
2.0000 g | INTRAVENOUS | Status: AC
  Administered 2014-09-18: 2 g via INTRAVENOUS

## 2014-09-18 MED ORDER — OXYCODONE HCL 5 MG/5ML PO SOLN: 5.0000 mg | Freq: Once | ORAL | Status: DC | PRN

## 2014-09-18 MED ORDER — PROPOFOL 10 MG/ML IV BOLUS
INTRAVENOUS | Status: DC | PRN
  Administered 2014-09-18: 200 mg via INTRAVENOUS

## 2014-09-18 MED ORDER — IBUPROFEN 800 MG PO TABS
800.0000 mg | ORAL_TABLET | Freq: Once | ORAL | Status: AC
  Administered 2014-09-18: 800 mg via ORAL
  Filled 2014-09-18: qty 4

## 2014-09-18 SURGICAL SUPPLY — 51 items
BLADE MINI RND TIP GREEN BEAV (BLADE) ×3 IMPLANT
BLADE SURG 15 STRL LF DISP TIS (BLADE) ×1 IMPLANT
BLADE SURG 15 STRL SS (BLADE) ×2
BNDG COHESIVE 3X5 TAN STRL LF (GAUZE/BANDAGES/DRESSINGS) ×3 IMPLANT
BNDG ESMARK 4X9 LF (GAUZE/BANDAGES/DRESSINGS) ×3 IMPLANT
BNDG GAUZE ELAST 4 BULKY (GAUZE/BANDAGES/DRESSINGS) ×3 IMPLANT
BUR FAST CUTTING MED (BURR) IMPLANT
CHLORAPREP W/TINT 26ML (MISCELLANEOUS) ×3 IMPLANT
CORDS BIPOLAR (ELECTRODE) ×3 IMPLANT
COVER BACK TABLE 60X90IN (DRAPES) ×3 IMPLANT
COVER MAYO STAND STRL (DRAPES) ×3 IMPLANT
CUFF TOURNIQUET SINGLE 18IN (TOURNIQUET CUFF) ×3 IMPLANT
DRAPE EXTREMITY T 121X128X90 (DRAPE) ×3 IMPLANT
DRAPE OEC MINIVIEW 54X84 (DRAPES) ×3 IMPLANT
DRAPE SURG 17X23 STRL (DRAPES) ×3 IMPLANT
GAUZE SPONGE 4X4 12PLY STRL (GAUZE/BANDAGES/DRESSINGS) ×3 IMPLANT
GAUZE XEROFORM 1X8 LF (GAUZE/BANDAGES/DRESSINGS) ×3 IMPLANT
GLOVE BIO SURGEON STRL SZ7.5 (GLOVE) ×3 IMPLANT
GLOVE BIOGEL PI IND STRL 7.0 (GLOVE) ×1 IMPLANT
GLOVE BIOGEL PI IND STRL 8 (GLOVE) ×1 IMPLANT
GLOVE BIOGEL PI IND STRL 8.5 (GLOVE) ×1 IMPLANT
GLOVE BIOGEL PI INDICATOR 7.0 (GLOVE) ×2
GLOVE BIOGEL PI INDICATOR 8 (GLOVE) ×2
GLOVE BIOGEL PI INDICATOR 8.5 (GLOVE) ×2
GLOVE ECLIPSE 6.5 STRL STRAW (GLOVE) ×6 IMPLANT
GLOVE SURG ORTHO 8.0 STRL STRW (GLOVE) ×3 IMPLANT
GOWN STRL REUS W/ TWL LRG LVL3 (GOWN DISPOSABLE) ×1 IMPLANT
GOWN STRL REUS W/TWL LRG LVL3 (GOWN DISPOSABLE) ×3
GOWN STRL REUS W/TWL XL LVL3 (GOWN DISPOSABLE) ×6 IMPLANT
K-WIRE .045 (WIRE) ×6 IMPLANT
NEEDLE HYPO 25X1 1.5 SAFETY (NEEDLE) ×3 IMPLANT
NS IRRIG 1000ML POUR BTL (IV SOLUTION) ×3 IMPLANT
PACK BASIN DAY SURGERY FS (CUSTOM PROCEDURE TRAY) ×3 IMPLANT
PAD CAST 3X4 CTTN HI CHSV (CAST SUPPLIES) ×1 IMPLANT
PADDING CAST ABS 3INX4YD NS (CAST SUPPLIES)
PADDING CAST ABS 4INX4YD NS (CAST SUPPLIES)
PADDING CAST ABS COTTON 3X4 (CAST SUPPLIES) IMPLANT
PADDING CAST ABS COTTON 4X4 ST (CAST SUPPLIES) IMPLANT
PADDING CAST COTTON 3X4 STRL (CAST SUPPLIES) ×2
SLEEVE SCD COMPRESS KNEE MED (MISCELLANEOUS) ×3 IMPLANT
SLING ARM FOAM STRAP XLG (SOFTGOODS) ×3 IMPLANT
SPLINT PLASTER CAST XFAST 3X15 (CAST SUPPLIES) ×15 IMPLANT
SPLINT PLASTER XTRA FASTSET 3X (CAST SUPPLIES) ×30
STOCKINETTE 4X48 STRL (DRAPES) ×3 IMPLANT
SUT VICRYL 4-0 PS2 18IN ABS (SUTURE) ×3 IMPLANT
SYR BULB 3OZ (MISCELLANEOUS) ×3 IMPLANT
SYR CONTROL 10ML LL (SYRINGE) ×3 IMPLANT
Screw 2U ×5 IMPLANT
TOWEL OR 17X24 6PK STRL BLUE (TOWEL DISPOSABLE) ×3 IMPLANT
TOWEL OR NON WOVEN STRL DISP B (DISPOSABLE) ×3 IMPLANT
UNDERPAD 30X30 INCONTINENT (UNDERPADS AND DIAPERS) ×3 IMPLANT

## 2014-09-18 NOTE — Discharge Instructions (Signed)

## 2014-09-18 NOTE — Anesthesia Procedure Notes (Addendum)
Anesthesia Regional Block:  Supraclavicular block  Pre-Anesthetic Checklist: ,, timeout performed, Correct Patient, Correct Site, Correct Laterality, Correct Procedure, Correct Position, site marked, Risks and benefits discussed,  Surgical consent,  Pre-op evaluation,  At surgeon's request and post-op pain management  Laterality: Left and Upper  Prep: chloraprep       Needles:   Needle Type: Echogenic Needle     Needle Length: 9cm 9 cm Needle Gauge: 21 and 21 G  Needle insertion depth: 4 cm   Additional Needles:  Procedures: ultrasound guided (picture in chart) and nerve stimulator Supraclavicular block Narrative:  Start time: 09/18/2014 9:10 AM End time: 09/18/2014 9:25 AM Injection made incrementally with aspirations every 5 mL.  Performed by: Personally  Anesthesiologist: MASSAGEE, TERRY  Additional Notes: Tolerated well   Procedure Name: LMA Insertion Date/Time: 09/18/2014 10:07 AM Performed by: Addiel Mccardle D Pre-anesthesia Checklist: Patient identified, Emergency Drugs available, Suction available and Patient being monitored Patient Re-evaluated:Patient Re-evaluated prior to inductionOxygen Delivery Method: Circle System Utilized Preoxygenation: Pre-oxygenation with 100% oxygen Intubation Type: IV induction Ventilation: Mask ventilation without difficulty LMA: LMA inserted LMA Size: 4.0 Number of attempts: 1 Airway Equipment and Method: Bite block Placement Confirmation: positive ETCO2 Tube secured with: Tape Dental Injury: Teeth and Oropharynx as per pre-operative assessment

## 2014-09-18 NOTE — Anesthesia Preprocedure Evaluation (Signed)
Anesthesia Evaluation  Patient identified by MRN, date of birth, ID band Patient awake    Reviewed: Allergy & Precautions  Airway Mallampati: I   Neck ROM: Full    Dental  (+) Teeth Intact   Pulmonary neg pulmonary ROS,  breath sounds clear to auscultation        Cardiovascular negative cardio ROS  Rhythm:Regular Rate:Normal     Neuro/Psych negative neurological ROS     GI/Hepatic negative GI ROS, Neg liver ROS,   Endo/Other  negative endocrine ROS  Renal/GU negative Renal ROS     Musculoskeletal   Abdominal   Peds  Hematology negative hematology ROS (+)   Anesthesia Other Findings   Reproductive/Obstetrics                             Anesthesia Physical Anesthesia Plan  ASA: I  Anesthesia Plan:    Post-op Pain Management:    Induction: Intravenous  Airway Management Planned: LMA  Additional Equipment:   Intra-op Plan:   Post-operative Plan: Extubation in OR  Informed Consent: I have reviewed the patients History and Physical, chart, labs and discussed the procedure including the risks, benefits and alternatives for the proposed anesthesia with the patient or authorized representative who has indicated his/her understanding and acceptance.   Dental advisory given  Plan Discussed with: CRNA and Surgeon  Anesthesia Plan Comments:         Anesthesia Quick Evaluation

## 2014-09-18 NOTE — Brief Op Note (Signed)
09/18/2014  11:06 AM  PATIENT:  Craig Davenport  44 y.o. 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  PROCEDURE:  Procedure(s): FUSION METACARPAL PHALANGEAL LEFT THUMB (Right)  SURGEON:  Surgeon(s) and Role:    * Daryll Brod, MD - Primary    * Leanora Cover, MD - Assisting  PHYSICIAN ASSISTANT:   ASSISTANTS: K Leena Tiede,MD   ANESTHESIA:   local, regional and general  EBL:  Total I/O In: 1000 [I.V.:1000] Out: -   BLOOD ADMINISTERED:none  DRAINS: none   LOCAL MEDICATIONS USED:  BUPIVICAINE   SPECIMEN:  No Specimen  DISPOSITION OF SPECIMEN:  N/A  COUNTS:  YES  TOURNIQUET:  * Missing tourniquet times found for documented tourniquets in log:  [id number] *  DICTATION: .Other Dictation: Dictation Number [id number]  PLAN OF CARE: Discharge to home after PACU  PATIENT DISPOSITION:  PACU - hemodynamically stable.

## 2014-09-18 NOTE — Op Note (Addendum)
Dictation Number [id number]  Intra-operative fluoroscopic images in the AP, lateral, and oblique views were taken and evaluated by myself.  Reduction and hardware placement were confirmed.

## 2014-09-18 NOTE — Progress Notes (Signed)
Assisted Dr. Massagee with left, ultrasound guided, supraclavicular block. Side rails up, monitors on throughout procedure. See vital signs in flow sheet. Tolerated Procedure well. 

## 2014-09-18 NOTE — Anesthesia Postprocedure Evaluation (Signed)
  Anesthesia Post-op Note  Patient: Craig Davenport  Procedure(s) Performed: Procedure(s): FUSION METACARPAL PHALANGEAL LEFT THUMB (Right)  Patient Location: PACU  Anesthesia Type:General and GA combined with regional for post-op pain  Level of Consciousness: awake and alert   Airway and Oxygen Therapy: Patient Spontanous Breathing  Post-op Pain: moderate  Post-op Assessment: Post-op Vital signs reviewed  Post-op Vital Signs: stable  Last Vitals:  Filed Vitals:   09/18/14 1230  BP: 122/74  Pulse: 79  Temp: 36.9 C  Resp: 18    Complications: No apparent anesthesia complications

## 2014-09-18 NOTE — H&P (Signed)
Craig Davenport is a 44 year-old right-hand dominant male who is referred from [facility] with an injury to his left thumb.  The injury occurred on 06/27/14 when he was attempting to place a cabinet on an assembly line and his left thumb was pulled backwards. He was placed in a splint.  He was given Motrin which has not given him any relief. He continues to complain of pain at the metacarpophalangeal joint, primarily radial aspect.  He has had x-rays done which are read out as normal.  He complains of constant, mild, dull aching pain with a feeling of numbness.  He states it has not changed.  Activity, exercise and work make this worse.  He has been wearing the forearm thumb spica splint and again states no prior history of injury.  He has had his MRI done and this reveals radial collateral ligament tear with arthritic changes at the metacarpophalangeal joint. I would not recommend a reconstruction.  We would recommend a fusion rather than a reconstruction of the ligament  and this will tighten the joint up and increase his arthritis.    ALLERGIES:      Oxycodone. MEDICATIONS:      Ibuprofen 800 mg. 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  He has had lumbar surgery,      bilaterally knee surgery, ear operation, node biopsy, chemo port. FAMILY MEDICAL HISTORY:   Negative. SOCIAL HISTORY:     He chews tobacco, he does not drink.  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 REVIEW OF SYSTEMS:     Positive for lymph node cancer, otherwise negative 14 points.  Craig Davenport is an 44 y.o. male.   Chief Complaint: Collateral liganment injury with Degenerative arthritis left themb HPI: see above  Past Medical History  Diagnosis Date  . Alcohol abuse   . 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 Other surgical history      Removal of mass behind eardrum   . Inner ear surgery     Removal of mass behind eardrum  . Back surgery      lumbar lam  . Axillary lymph node biopsy  2004    right  . Knee arthroscopy      right and left  . Cholecystectomy      History reviewed. No pertinent family history. Social History:  reports that he has never smoked. He does not have any smokeless tobacco history on file. He reports that he does not drink alcohol or use illicit drugs.  Allergies:  Allergies  Allergen Reactions  . Oxycodone Hives    No prescriptions prior to admission    No results found for this or any previous visit (from the past 48 hour(s)).  No results found.   Pertinent items are noted in HPI.  Height 5\' 4"  (1.626 m), weight 77.111 kg (170 lb).  General appearance: alert, cooperative and appears stated age Head: Normocephalic, without obvious abnormality Neck: no JVD Resp: clear to auscultation bilaterally Cardio: regular rate and rhythm, S1, S2 normal, no murmur, click, rub or gallop GI: soft, non-tender; bowel sounds normal; no masses,  no organomegaly Extremities: pain MCP left thumb Pulses: 2+ and symmetric Skin: Skin color, texture, turgor normal. No rashes or lesions Neurologic: Grossly normal Incision/Wound: na  Assessment/Plan RADIOGRAPHS:   X-rays reveal subluxation of his proximal  phalanx to the ulnar side at the metacarpophalangeal joint.  There is some pointing of the proximal aspect of the proximal phalanx.   DIAGNOSIS:     Radial collateral ligament tear metacarpophalangeal joint left thumb with arthritis.  He is aware there is no guarantee with the surgery, possibility of infection, recurrence, injury to arteries, nerves, tendons, incomplete relief of symptoms and dystrophy.  He is scheduled for fusion metacarpophalangeal joint left thumb.  He will be at one-handed work for a minimum of 12 weeks.    Craig Davenport does not want any narcotics.  We have given him a prescription for Celebrex to take 200 mg. one twice a day for two days  pre-op and then for three days post-op and then one a day following this in an attempt to see if we can provide him analgesia without necessitating narcotic use.  He will be scheduled as an outpatient.  He is aware there is no guarantee with the surgery, possibility of infection, recurrence, injury to arteries, nerves, tendons, incomplete relief of symptoms and dystrophy, the possibility of nonhealing.  Craig Davenport R 09/18/2014, 7:41 AM

## 2014-09-18 NOTE — Transfer of Care (Signed)
Immediate Anesthesia Transfer of Care Note  Patient: Craig Davenport  Procedure(s) Performed: Procedure(s): FUSION METACARPAL PHALANGEAL LEFT THUMB (Right)  Patient Location: PACU  Anesthesia Type:GA combined with regional for post-op pain  Level of Consciousness: awake and patient cooperative  Airway & Oxygen Therapy: Patient Spontanous Breathing and Patient connected to face mask oxygen  Post-op Assessment: Report given to RN and Post -op Vital signs reviewed and stable  Post vital signs: Reviewed and stable  Last Vitals:  Filed Vitals:   09/18/14 0927  BP:   Pulse: 65  Temp:   Resp: 11    Complications: No apparent anesthesia complications

## 2014-09-19 ENCOUNTER — Encounter (HOSPITAL_BASED_OUTPATIENT_CLINIC_OR_DEPARTMENT_OTHER): Payer: Self-pay | Admitting: Orthopedic Surgery

## 2014-09-19 NOTE — Op Note (Signed)
NAME:  SUNG, PARODI NO.:  [id number]  MEDICAL RECORD NO.:  [id number]  LOCATION:                                 FACILITY:  PHYSICIAN:  Daryll Brod, M.D.            DATE OF BIRTH:  DATE OF PROCEDURE:  09/18/2014 DATE OF DISCHARGE:                              OPERATIVE REPORT   PREOPERATIVE DIAGNOSIS:  Collateral ligament injury with arthritis, metacarpophalangeal joint of left thumb.  POSTOPERATIVE DIAGNOSIS:  Collateral ligament injury with arthritis, metacarpophalangeal joint of left thumb.  OPERATION:  Fusion, left thumb metacarpophalangeal joint.  SURGEON:  Daryll Brod, MD.  ASSISTANT:  Leanora Cover, MD.  ANESTHESIA:  Supraclavicular block general with local infiltration.  ANESTHESIOLOGIST:  Ala Dach, M.D.  HISTORY:  The patient is a 44 year old male who suffered an injury to the metacarpophalangeal joint of his left thumb with gross instability in the radial side with ulnar deviation of the metacarpophalangeal joint.  X-rays and MRI confirm arthritic changes at the joint.  We had a long discussion with him with respect to possible repair of ligament versus fusion of the joint.  He is aware with arthritic changes should the ligament be repaired, it may eventuate increased pain resulting in the necessity of a fusion.  Rather than 2 operations, he would prefer to proceed with fusion at this time.  He is aware that there is no guarantee with the surgery; possibility of infection; recurrence of injury to arteries, nerves, tendons; incomplete relief of symptoms; dystrophy; possibility of nonhealing necessitating re-fixation, bone grafting, or loss of fixation.  In the preoperative area, the patient was seen. The extremity marked by both patient and surgeon.  Antibiotic given.  PROCEDURE IN DETAIL:  The patient was brought to the operating room, where a general anesthetic was carried out without difficulty.  He had been given a supraclavicular  block in the preoperative area.  He was prepped and draped using ChloraPrep in supine position with the left arm free.  A 3-minute dry time was allowed.  Time-out taken, confirming the patient and procedure.  The limb was exsanguinated with an Esmarch bandage.  Tourniquet placed high on the arm was inflated to 250 mmHg.  A longitudinal incision was made over the metacarpophalangeal joint, carried down through subcutaneous tissue.  Bleeders were electrocauterized with bipolar.  The interval between the EPB and EPL was then incised allowing the separation of the 2 tendons.  The EPB was released from the proximal phalanx.  The collateral ligaments were incised.  This allowed opening of the joint.  Joint was visualized, significant degenerative changes were present with loss of cartilage over majority of the metacarpal head and significant loss of cartilage on the proximal aspect of the proximal phalanx.  With rongeur, the remainder of the cartilage and subchondral bone was removed from the metacarpal.  Drill holes were then placed in the base of the proximal phalanx.  These were completed with a House curette.  The subchondral bone was removed.  The metacarpal head was then shaped to a trough and tenon.  This was then compressed, irrigated.  Guide pins placed with the application of 2 Arex  headless screws in a cross manner.  These were then inserted measuring 20 and 25 mm.  These firmly fixed the bone in position.  Portions of the bone that had been removed earlier was then morselized and placed into the small gaps.  The wound was again irrigated.  The extensor tendon then was repaired with a running 4-0 Vicryl suture.  The skin was closed with interrupted 4-0 nylon sutures. X-rays in the AP, lateral, and oblique direction revealed that the joint lied in good position with approximately a 15-degree flexion attitude at the metacarpophalangeal joint with eased pinch to the index middle finger.   A sterile compressive dressing, thumb spica splint dorsal palmar were applied.  On deflation of the tourniquet, all fingers immediately pinked.  He was taken to the recovery room for observation in satisfactory condition.  He requested no pain medicine.  He has been on Celebrex.  He will return to the office in 1 week.          ______________________________ Daryll Brod, M.D.     GK/MEDQ  D:  09/18/2014  T:  09/18/2014  Job:  [id number]

## 2015-12-18 ENCOUNTER — Emergency Department (HOSPITAL_COMMUNITY)
Admission: EM | Admit: 2015-12-18 | Discharge: 2015-12-18 | Disposition: A | Discharge status: Home / Self Care | Payer: BLUE CROSS/BLUE SHIELD | Attending: Emergency Medicine | Admitting: Emergency Medicine

## 2015-12-18 ENCOUNTER — Encounter (HOSPITAL_COMMUNITY): Payer: Self-pay | Admitting: *Deleted

## 2015-12-18 DIAGNOSIS — T25022A Burn of unspecified degree of left foot, initial encounter: Secondary | ICD-10-CM | POA: Diagnosis present

## 2015-12-18 DIAGNOSIS — X12XXXA Contact with other hot fluids, initial encounter: Secondary | ICD-10-CM | POA: Diagnosis not present

## 2015-12-18 DIAGNOSIS — Y929 Unspecified place or not applicable: Secondary | ICD-10-CM | POA: Insufficient documentation

## 2015-12-18 DIAGNOSIS — Y990 Civilian activity done for income or pay: Secondary | ICD-10-CM | POA: Insufficient documentation

## 2015-12-18 DIAGNOSIS — T22111A Burn of first degree of right forearm, initial encounter: Secondary | ICD-10-CM | POA: Diagnosis not present

## 2015-12-18 DIAGNOSIS — Y939 Activity, unspecified: Secondary | ICD-10-CM | POA: Insufficient documentation

## 2015-12-18 DIAGNOSIS — T25222A Burn of second degree of left foot, initial encounter: Secondary | ICD-10-CM | POA: Diagnosis not present

## 2015-12-18 MED ORDER — TRAMADOL HCL 50 MG PO TABS: 50.0000 mg | ORAL_TABLET | Freq: Four times a day (QID) | ORAL | Status: DC | PRN

## 2015-12-18 MED ORDER — SILVER SULFADIAZINE 1 % EX CREA
TOPICAL_CREAM | Freq: Once | CUTANEOUS | Status: AC
  Administered 2015-12-18: 18:00:00 via TOPICAL
  Filled 2015-12-18: qty 85

## 2015-12-18 MED ORDER — HYDROMORPHONE HCL 1 MG/ML IJ SOLN
2.0000 mg | Freq: Once | INTRAMUSCULAR | Status: AC
  Administered 2015-12-18: 2 mg via INTRAMUSCULAR
  Filled 2015-12-18: qty 2

## 2015-12-18 MED ORDER — NAPROXEN 500 MG PO TABS: 500.0000 mg | ORAL_TABLET | Freq: Two times a day (BID) | ORAL | Status: DC

## 2015-12-18 MED ORDER — DIPHENHYDRAMINE HCL 25 MG PO CAPS
25.0000 mg | ORAL_CAPSULE | Freq: Once | ORAL | Status: AC
  Administered 2015-12-18: 25 mg via ORAL
  Filled 2015-12-18: qty 1
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
## 2015-12-19 ENCOUNTER — Ambulatory Visit (HOSPITAL_COMMUNITY)
Admission: EM | Admit: 2015-12-19 | Discharge: 2015-12-19 | Disposition: A | Discharge status: Home / Self Care | Payer: BLUE CROSS/BLUE SHIELD | Attending: Emergency Medicine | Admitting: Emergency Medicine

## 2015-12-19 ENCOUNTER — Encounter (HOSPITAL_COMMUNITY): Payer: Self-pay | Admitting: *Deleted

## 2015-12-19 DIAGNOSIS — Z09 Encounter for follow-up examination after completed treatment for conditions other than malignant neoplasm: Secondary | ICD-10-CM | POA: Diagnosis not present

## 2015-12-19 NOTE — ED Notes (Signed)
kerlex placed to right arm and left foot, extra supplies given for home use. No signs of infection noted. Patient with pain medication at home, has not taken this am.

## 2015-12-19 NOTE — Discharge Instructions (Signed)
Wound Care °Taking care of your wound properly can help to prevent pain and infection. It can also help your wound to heal more quickly.  °HOW TO CARE FOR YOUR WOUND  °· Take or apply over-the-counter and prescription medicines only as told by your health care provider. °· If you were prescribed antibiotic medicine, take or apply it as told by your health care provider. Do not stop using the antibiotic even if your condition improves. °· Clean the wound each day or as told by your health care provider. °¨ Wash the wound with mild soap and water. °¨ Rinse the wound with water to remove all soap. °¨ Pat the wound dry with a clean towel. Do not rub it. °· There are many different ways to close and cover a wound. For example, a wound can be covered with stitches (sutures), skin glue, or adhesive strips. Follow instructions from your health care provider about: °¨ How to take care of your wound. °¨ When and how you should change your bandage (dressing). °¨ When you should remove your dressing. °¨ Removing whatever was used to close your wound. °· Check your wound every day for signs of infection. Watch for: °¨ Redness, swelling, or pain. °¨ Fluid, blood, or pus. °· Keep the dressing dry until your health care provider says it can be removed. Do not take baths, swim, use a hot tub, or do anything that would put your wound underwater until your health care provider approves. °· Raise (elevate) the injured area above the level of your heart while you are sitting or lying down. °· Do not scratch or pick at the wound. °· Keep all follow-up visits as told by your health care provider. This is important. °SEEK MEDICAL CARE IF: °· You received a tetanus shot and you have swelling, severe pain, redness, or bleeding at the injection site. °· You have a fever. °· Your pain is not controlled with medicine. °· You have increased redness, swelling, or pain at the site of your wound. °· You have fluid, blood, or pus coming from your  wound. °· You notice a bad smell coming from your wound or your dressing. °SEEK IMMEDIATE MEDICAL CARE IF: °· You have a red streak going away from your wound. °  °This information is not intended to replace advice given to you by your health care provider. Make sure you discuss any questions you have with your health care provider. °  °Document Released: 03/10/2008 Document Revised: 10/16/2014 Document Reviewed: 05/28/2014 °Elsevier Interactive Patient Education ©2016 Elsevier Inc. ° °
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
## 2016-06-30 DIAGNOSIS — C8594 Non-Hodgkin lymphoma, unspecified, lymph nodes of axilla and upper limb: Secondary | ICD-10-CM | POA: Diagnosis not present

## 2016-06-30 DIAGNOSIS — R590 Localized enlarged lymph nodes: Secondary | ICD-10-CM

## 2016-06-30 DIAGNOSIS — R319 Hematuria, unspecified: Secondary | ICD-10-CM | POA: Diagnosis not present

## 2016-06-30 DIAGNOSIS — R61 Generalized hyperhidrosis: Secondary | ICD-10-CM

## 2016-10-08 DIAGNOSIS — R590 Localized enlarged lymph nodes: Secondary | ICD-10-CM | POA: Diagnosis not present

## 2016-10-08 DIAGNOSIS — Z8572 Personal history of non-Hodgkin lymphomas: Secondary | ICD-10-CM | POA: Diagnosis not present

## 2016-11-11 DIAGNOSIS — Z8572 Personal history of non-Hodgkin lymphomas: Secondary | ICD-10-CM | POA: Diagnosis not present

## 2016-11-11 DIAGNOSIS — R590 Localized enlarged lymph nodes: Secondary | ICD-10-CM | POA: Diagnosis not present

## 2016-11-18 DIAGNOSIS — C8290 Follicular lymphoma, unspecified, unspecified site: Secondary | ICD-10-CM | POA: Insufficient documentation

## 2017-04-30 DIAGNOSIS — Z8572 Personal history of non-Hodgkin lymphomas: Secondary | ICD-10-CM | POA: Diagnosis not present

## 2017-04-30 DIAGNOSIS — R590 Localized enlarged lymph nodes: Secondary | ICD-10-CM | POA: Diagnosis not present

## 2017-11-26 DIAGNOSIS — Z923 Personal history of irradiation: Secondary | ICD-10-CM | POA: Diagnosis not present

## 2017-11-26 DIAGNOSIS — Z9221 Personal history of antineoplastic chemotherapy: Secondary | ICD-10-CM | POA: Diagnosis not present

## 2017-11-26 DIAGNOSIS — R590 Localized enlarged lymph nodes: Secondary | ICD-10-CM | POA: Diagnosis not present

## 2017-11-26 DIAGNOSIS — Z8572 Personal history of non-Hodgkin lymphomas: Secondary | ICD-10-CM

## 2018-05-19 DIAGNOSIS — Z9221 Personal history of antineoplastic chemotherapy: Secondary | ICD-10-CM | POA: Diagnosis not present

## 2018-05-19 DIAGNOSIS — Z923 Personal history of irradiation: Secondary | ICD-10-CM | POA: Diagnosis not present

## 2018-05-19 DIAGNOSIS — C8594 Non-Hodgkin lymphoma, unspecified, lymph nodes of axilla and upper limb: Secondary | ICD-10-CM | POA: Diagnosis not present

## 2018-05-19 DIAGNOSIS — R590 Localized enlarged lymph nodes: Secondary | ICD-10-CM | POA: Diagnosis not present

## 2018-05-19 DIAGNOSIS — C8336 Diffuse large B-cell lymphoma, intrapelvic lymph nodes: Secondary | ICD-10-CM | POA: Diagnosis not present

## 2018-11-17 DIAGNOSIS — C8594 Non-Hodgkin lymphoma, unspecified, lymph nodes of axilla and upper limb: Secondary | ICD-10-CM | POA: Diagnosis not present

## 2018-11-17 DIAGNOSIS — C8336 Diffuse large B-cell lymphoma, intrapelvic lymph nodes: Secondary | ICD-10-CM | POA: Diagnosis not present

## 2018-12-12 DIAGNOSIS — C8514 Unspecified B-cell lymphoma, lymph nodes of axilla and upper limb: Secondary | ICD-10-CM | POA: Diagnosis not present

## 2018-12-12 DIAGNOSIS — K210 Gastro-esophageal reflux disease with esophagitis: Secondary | ICD-10-CM | POA: Diagnosis not present

## 2018-12-12 DIAGNOSIS — E782 Mixed hyperlipidemia: Secondary | ICD-10-CM | POA: Diagnosis not present

## 2018-12-12 DIAGNOSIS — F5221 Male erectile disorder: Secondary | ICD-10-CM | POA: Diagnosis not present

## 2019-01-09 ENCOUNTER — Ambulatory Visit: Payer: Self-pay

## 2019-01-09 ENCOUNTER — Ambulatory Visit (INDEPENDENT_AMBULATORY_CARE_PROVIDER_SITE_OTHER): Payer: BC Managed Care – PPO | Admitting: Orthopaedic Surgery

## 2019-01-09 ENCOUNTER — Encounter: Payer: Self-pay | Admitting: Physician Assistant

## 2019-01-09 DIAGNOSIS — M25562 Pain in left knee: Secondary | ICD-10-CM | POA: Diagnosis not present

## 2019-01-09 DIAGNOSIS — G8929 Other chronic pain: Secondary | ICD-10-CM

## 2019-01-09 MED ORDER — LIDOCAINE HCL 1 % IJ SOLN
2.0000 mL | INTRAMUSCULAR | Status: AC | PRN
  Administered 2019-01-09: 2 mL

## 2019-01-09 MED ORDER — BUPIVACAINE HCL 0.25 % IJ SOLN
2.0000 mL | INTRAMUSCULAR | Status: AC | PRN
  Administered 2019-01-09: 08:00:00 2 mL via INTRA_ARTICULAR

## 2019-01-09 MED ORDER — METHYLPREDNISOLONE ACETATE 40 MG/ML IJ SUSP
40.0000 mg | INTRAMUSCULAR | Status: AC | PRN
  Administered 2019-01-09: 40 mg via INTRA_ARTICULAR

## 2019-01-09 NOTE — Progress Notes (Signed)
Office Visit Note   Patient: Craig Davenport           Date of Birth: [birth year]           MRN: [id number] Visit Date: 01/09/2019              Requested by: Lillard Anes, MD [address] [place],  [place] [address] PCP: Lillard Anes, MD   Assessment & Plan: Visit Diagnoses:  1. Chronic pain of left knee     Plan: Impression is left knee arthritis flareup versus less likely recurrent loose body.  Today, we will inject the left knee with cortisone.  He will give this at least 2 weeks and then let us know if he is not any better.  Follow-up with [place] as needed.  Call with concerns or questions anytime.  Follow-Up Instructions: Return if symptoms worsen or fail to improve.   Orders:  Orders Placed This Encounter  Procedures   Large Joint Inj: L knee   XR Knee 1-2 Views Left   No orders of the defined types were placed in this encounter.     Procedures: Large Joint Inj: L knee on 01/09/2019 8:29 AM Indications: pain Details: 22 G needle, anterolateral approach Medications: 2 mL bupivacaine 0.25 %; 2 mL lidocaine 1 %; 40 mg methylPREDNISolone acetate 40 MG/ML      Clinical Data: No additional findings.   Subjective: Chief Complaint  Patient presents with   Left Knee - Pain    HPI patient is a pleasant 48 year old gentleman who presents our clinic today with left knee pain.  This is been ongoing for the past week.  No known injury or change in activity.  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  The pain he has been having is to the anterior aspect.  He describes this as a constant throb with occasional locking sensation.  No instability.  Pain is worse with flexion of the knee.  He has been taking ibuprofen without relief of symptoms.  He does note a previous left knee arthroscopy with loose body removal approximately 10 to 15 years ago.  Doing well until  recently.  Review of Systems as detailed in HPI.  All others reviewed and are negative.   Objective: Vital Signs: There were no vitals taken for this visit.  Physical Exam well-developed well-nourished gentleman in no acute distress.  Alert and oriented x3.  Ortho Exam examination of the left knee shows no effusion.  Minimal patellofemoral crepitus.  Medial joint line tenderness.  Ligaments are stable.  He is neurovascularly intact distally.  Specialty Comments:  No specialty comments available.  Imaging: Xr Knee 1-2 Views Left  Result Date: 01/09/2019 X-rays demonstrate minimal tricompartmental degenerative changes.  No evidence of loose body.    PMFS History: Patient Active Problem List   Diagnosis Date Noted   Chest pain 04/29/2013   Past Medical History:  Diagnosis Date   Alcohol abuse    Lymphoma ([place]) 2004   had radiation and chemo    History reviewed. No pertinent family history.  Past Surgical History:  Procedure Laterality Date   AXILLARY LYMPH NODE BIOPSY  2004   right   BACK SURGERY     lumbar lam   CHOLECYSTECTOMY     FINGER ARTHRODESIS Right 09/18/2014   Procedure: FUSION METACARPAL PHALANGEAL LEFT THUMB;  Surgeon: Daryll Brod,  MD;  Location: [facility];  Service: Orthopedics;  Laterality: Right;   INNER EAR SURGERY     Removal of mass behind eardrum   KNEE ARTHROSCOPY     right and left   OTHER SURGICAL HISTORY     Removal of mass behind eardrum    Social History   Occupational History   Not on file  Tobacco Use   Smoking status: Never Smoker  Substance and Sexual Activity   Alcohol use: No    Comment: hx abuse-been sober 2013   Drug use: No   Sexual activity: Not on file

## 2019-04-22 DIAGNOSIS — J0100 Acute maxillary sinusitis, unspecified: Secondary | ICD-10-CM | POA: Diagnosis not present

## 2019-08-05 ENCOUNTER — Other Ambulatory Visit: Payer: Self-pay | Admitting: Legal Medicine

## 2019-11-22 ENCOUNTER — Other Ambulatory Visit: Payer: Self-pay | Admitting: Legal Medicine

## 2020-03-18 ENCOUNTER — Other Ambulatory Visit: Payer: Self-pay | Admitting: Family Medicine

## 2020-03-26 ENCOUNTER — Ambulatory Visit (INDEPENDENT_AMBULATORY_CARE_PROVIDER_SITE_OTHER): Payer: Commercial Managed Care - PPO | Admitting: Legal Medicine

## 2020-03-26 ENCOUNTER — Other Ambulatory Visit: Payer: Self-pay

## 2020-03-26 ENCOUNTER — Encounter: Payer: Self-pay | Admitting: Legal Medicine

## 2020-03-26 DIAGNOSIS — E782 Mixed hyperlipidemia: Secondary | ICD-10-CM

## 2020-03-26 DIAGNOSIS — K2100 Gastro-esophageal reflux disease with esophagitis, without bleeding: Secondary | ICD-10-CM

## 2020-03-26 DIAGNOSIS — C8514 Unspecified B-cell lymphoma, lymph nodes of axilla and upper limb: Secondary | ICD-10-CM

## 2020-03-26 DIAGNOSIS — N529 Male erectile dysfunction, unspecified: Secondary | ICD-10-CM | POA: Diagnosis not present

## 2020-03-26 HISTORY — DX: Mixed hyperlipidemia: E78.2

## 2020-03-26 HISTORY — DX: Gastro-esophageal reflux disease with esophagitis, without bleeding: K21.00

## 2020-03-26 HISTORY — DX: Unspecified b-cell lymphoma, lymph nodes of axilla and upper limb: C85.14

## 2020-03-26 HISTORY — DX: Male erectile dysfunction, unspecified: N52.9

## 2020-03-26 MED ORDER — SILDENAFIL CITRATE 20 MG PO TABS: 20.0000 mg | ORAL_TABLET | Freq: Three times a day (TID) | ORAL | 6 refills | Status: DC

## 2020-03-26 NOTE — Progress Notes (Signed)
Subjective:  Patient ID: Craig Davenport, male    DOB: 1971/02/17  Age: 49 y.o. MRN: [id number]  Chief Complaint  Patient presents with   Lymphoma    HPI: Chronic follow up  Patients B-cell lymphoma is in remission per Dr. Hinton Rao.  Patient has quit drinking in past .  His GERD is stable and he has ED.  He has hyperlipidemia and on no medicines.     Current Outpatient Medications on File Prior to Visit  Medication Sig Dispense Refill   Ascorbic Acid (VITAMIN C) 100 MG tablet Take by mouth.     cetirizine (ZYRTEC) 10 MG tablet Take 10 mg by mouth daily.     ondansetron (ZOFRAN-ODT) 4 MG disintegrating tablet Take by mouth.     traMADol (ULTRAM) 50 MG tablet Take 1 tablet (50 mg total) by mouth every 6 (six) hours as needed. 15 tablet 0   No current facility-administered medications on file prior to visit.   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 ([place]) 03/26/2020   Past Surgical History:  Procedure Laterality Date   AXILLARY LYMPH NODE BIOPSY  2004   right   BACK SURGERY     lumbar lam   CHOLECYSTECTOMY     FINGER ARTHRODESIS Right 09/18/2014   Procedure: FUSION METACARPAL PHALANGEAL LEFT THUMB;  Surgeon: Daryll Brod, MD;  Location: [facility];  Service: Orthopedics;  Laterality: Right;   INNER EAR SURGERY     Removal of mass behind eardrum   KNEE ARTHROSCOPY     right and left   OTHER SURGICAL HISTORY     Removal of mass behind eardrum     Family History  Problem Relation Age of Onset   Osteoarthritis Father    Migraines Sister    Social History   Socioeconomic History   Marital status: Divorced    Spouse name: Not on file   Number of children: 1   Years of  education: Not on file   Highest education level: Not on file  Occupational History   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  Tobacco Use   Smoking status: Never Smoker   Smokeless tobacco: Never Used  Substance and Sexual Activity   Alcohol use: Not Currently    Comment: hx abuse-been sober 2013   Drug use: Never   Sexual activity: Yes    Partners: Female  Other Topics Concern   Not on file  Social History Narrative   Not on file   Social Determinants of Health   Financial Resource Strain:    Difficulty of Paying Living Expenses: Not on file  Food Insecurity:    Worried About Charity fundraiser in the Last Year: Not on file   [employer] of Food in the Last Year: Not on file  Transportation Needs:    Lack of Transportation (Medical): Not on file   Lack of Transportation (Non-Medical): Not on file  Physical Activity:    Days of Exercise per Week: Not on file   Minutes of Exercise per Session: Not on file  Stress:    Feeling of Stress : Not on file  Social Connections:    Frequency of Communication with Friends and Family: Not on file  Frequency of Social Gatherings with Friends and Family: Not on file   Attends Religious Services: Not on file   Active Member of Clubs or Organizations: Not on file   Attends Archivist Meetings: Not on file   Marital Status: Not on file    Review of Systems  Constitutional: Negative.   HENT: Negative.   Respiratory: Negative for cough, choking and shortness of breath.   Cardiovascular: Negative for chest pain, palpitations and leg swelling.  Gastrointestinal: Negative.   Genitourinary: Negative.   Musculoskeletal: Negative.   Skin: Negative.   Neurological: Negative.   Psychiatric/Behavioral: Negative.      Objective:  BP 128/60    Pulse 90    Temp 98 F (36.7 C)    Resp 16    Ht 5\' 4"  (1.626 m)    Wt 203 lb (92.1 kg)    SpO2 98%    BMI 34.84 kg/m   BP/Weight 03/26/2020 02/21/2425 01/16/4195  Systolic BP  222 979 892  Diastolic BP 60 78 64  Wt. (Lbs) 203 - -  BMI 34.84 - -    Physical Exam Vitals reviewed.  Constitutional:      Appearance: Normal appearance.  HENT:     Right Ear: Tympanic membrane normal.     Nose: Nose normal.     Mouth/Throat:     Mouth: Mucous membranes are moist.     Pharynx: Oropharynx is clear.  Eyes:     Extraocular Movements: Extraocular movements intact.     Pupils: Pupils are equal, round, and reactive to light.  Cardiovascular:     Rate and Rhythm: Normal rate and regular rhythm.     Pulses: Normal pulses.     Heart sounds: Normal heart sounds.  Pulmonary:     Effort: Pulmonary effort is normal.     Breath sounds: Normal breath sounds.  Abdominal:     General: Abdomen is flat. Bowel sounds are normal.     Palpations: Abdomen is soft.  Musculoskeletal:        General: Normal range of motion.     Cervical back: Normal range of motion.  Skin:    General: Skin is dry.     Capillary Refill: Capillary refill takes less than 2 seconds.  Neurological:     General: No focal deficit present.     Mental Status: He is alert.  Psychiatric:        Mood and Affect: Mood normal.        Behavior: Behavior normal.       Lab Results  Component Value Date   WBC 7.5 04/30/2013   HGB 14.8 09/18/2014   HCT 41.3 04/30/2013   PLT 220 04/30/2013   GLUCOSE 102 (H) 04/30/2013   CHOL 130 04/30/2013   TRIG 65 04/30/2013   HDL 37 (L) 04/30/2013   LDLCALC 80 04/30/2013   ALT 79 (H) 03/25/2011   AST 150 (H) 03/25/2011   NA 139 04/30/2013   K 4.2 04/30/2013   CL 104 04/30/2013   CREATININE 0.88 04/30/2013   BUN 11 04/30/2013   CO2 24 04/30/2013   INR 0.96 04/29/2013      Assessment & Plan:   1. 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  He is on no medicine for lymphoma  2. Mixed hyperlipidemia AN INDIVIDUAL CARE PLAN for hyperlipidemia/ cholesterol was established and reinforced  today.  The patient's status was assessed using clinical findings  on exam, lab and other diagnostic tests. The patient's disease status was assessed based on evidence-based guidelines and found to be well controlled. MEDICATIONS were reviewed. SELF MANAGEMENT GOALS have been discussed and patient's success at attaining the goal of low cholesterol was assessed. RECOMMENDATION given include regular exercise 3 days a week and low cholesterol/low fat diet. CLINICAL SUMMARY including written plan to identify barriers unique to the patient due to social or economic  reasons was discussed.  3. Male erectile disorder AN INDIVIDUAL CARE PLAN for ED was established and reinforced today.  The patient's status was assessed using clinical findings on exam, labs, and other diagnostic testing. Patient's success at meeting treatment goals based on disease specific evidence-bassed guidelines and found to be in good control. RECOMMENDATIONS include maintaining present medicines and treatment.  4. Gastroesophageal reflux disease with esophagitis without hemorrhage Plan of care was formulated today.  She is doing well.  A plan of care was formulated using patient exam, tests and other sources to optimize care using evidence based information.  Recommend no smoking, no eating after supper, avoid fatty foods, elevate Head of bed, avoid tight fitting clothing.  Continue on OTC.         Follow-up: Return in about 6 months (around 09/24/2020) for fasting.  An After Visit Summary was printed and given to the patient.  [place] [phone]

## 2020-03-27 LAB — CBC WITH DIFFERENTIAL/PLATELET
Basophils Absolute: 0.1 10*3/uL (ref 0.0–0.2)
Basos: 2 %
EOS (ABSOLUTE): 0.2 10*3/uL (ref 0.0–0.4)
Eos: 3 %
Hematocrit: 45.7 % (ref 37.5–51.0)
Hemoglobin: 15 g/dL (ref 13.0–17.7)
Immature Grans (Abs): 0 10*3/uL (ref 0.0–0.1)
Immature Granulocytes: 0 %
Lymphocytes Absolute: 1.5 10*3/uL (ref 0.7–3.1)
Lymphs: 24 %
MCH: 29.3 pg (ref 26.6–33.0)
MCHC: 32.8 g/dL (ref 31.5–35.7)
MCV: 89 fL (ref 79–97)
Monocytes Absolute: 0.6 10*3/uL (ref 0.1–0.9)
Monocytes: 10 %
Neutrophils Absolute: 3.9 10*3/uL (ref 1.4–7.0)
Neutrophils: 61 %
Platelets: 252 10*3/uL (ref 150–450)
RBC: 5.12 x10E6/uL (ref 4.14–5.80)
RDW: 12.8 % (ref 11.6–15.4)
WBC: 6.3 10*3/uL (ref 3.4–10.8)

## 2020-03-27 LAB — COMPREHENSIVE METABOLIC PANEL
ALT: 20 IU/L (ref 0–44)
AST: 15 IU/L (ref 0–40)
Albumin/Globulin Ratio: 2.5 — ABNORMAL HIGH (ref 1.2–2.2)
Albumin: 4.7 g/dL (ref 4.0–5.0)
Alkaline Phosphatase: 75 IU/L (ref 44–121)
BUN/Creatinine Ratio: 11 (ref 9–20)
BUN: 13 mg/dL (ref 6–24)
Bilirubin Total: 0.3 mg/dL (ref 0.0–1.2)
CO2: 26 mmol/L (ref 20–29)
Calcium: 10.1 mg/dL (ref 8.7–10.2)
Chloride: 101 mmol/L (ref 96–106)
Creatinine, Ser: 1.18 mg/dL (ref 0.76–1.27)
GFR calc Af Amer: 84 mL/min/{1.73_m2} (ref >59)
GFR calc non Af Amer: 73 mL/min/{1.73_m2} (ref >59)
Globulin, Total: 1.9 g/dL (ref 1.5–4.5)
Glucose: 91 mg/dL (ref 65–99)
Potassium: 4.7 mmol/L (ref 3.5–5.2)
Sodium: 140 mmol/L (ref 134–144)
Total Protein: 6.6 g/dL (ref 6.0–8.5)

## 2020-03-27 LAB — CARDIOVASCULAR RISK ASSESSMENT

## 2020-03-27 LAB — LIPID PANEL
Chol/HDL Ratio: 4.5 ratio (ref 0.0–5.0)
Cholesterol, Total: 196 mg/dL (ref 100–199)
HDL: 44 mg/dL (ref >39)
LDL Chol Calc (NIH): 133 mg/dL — ABNORMAL HIGH (ref 0–99)
Triglycerides: 102 mg/dL (ref 0–149)
VLDL Cholesterol Cal: 19 mg/dL (ref 5–40)

## 2020-03-27 NOTE — Progress Notes (Signed)
LDL-c high at 133, need to use Dash diet to lower, glucose 102, kidney tests normal, liver tests all normal, CBC normal,  lp

## 2020-08-24 ENCOUNTER — Other Ambulatory Visit: Payer: Self-pay | Admitting: Legal Medicine

## 2020-08-24 DIAGNOSIS — N529 Male erectile dysfunction, unspecified: Secondary | ICD-10-CM

## 2020-10-06 DIAGNOSIS — J069 Acute upper respiratory infection, unspecified: Secondary | ICD-10-CM | POA: Diagnosis not present

## 2020-10-06 DIAGNOSIS — R0981 Nasal congestion: Secondary | ICD-10-CM | POA: Diagnosis not present

## 2021-02-11 ENCOUNTER — Ambulatory Visit: Payer: BC Managed Care – PPO | Admitting: Legal Medicine

## 2021-02-11 ENCOUNTER — Other Ambulatory Visit: Payer: Self-pay

## 2021-02-11 ENCOUNTER — Encounter: Payer: Self-pay | Admitting: Legal Medicine

## 2021-02-11 DIAGNOSIS — C8290 Follicular lymphoma, unspecified, unspecified site: Secondary | ICD-10-CM | POA: Diagnosis not present

## 2021-02-11 NOTE — Assessment & Plan Note (Signed)
Diagnosed in 2005 and treated a [facility].

## 2021-02-11 NOTE — Progress Notes (Signed)
Established Patient Office Visit  Subjective:  Patient ID: Craig Davenport, male    DOB: 06/14/1971  Age: 50 y.o. MRN: [id number]  CC:  Chief Complaint  Patient presents with   Groin Swelling    HPI DMARKUS Davenport presents for right inguinal pain for one week.  Just started hurting.  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 Olen Pel. No discharge  [name] did have a nodular lymphoma and last biopsy in 2018 at [name] was negative.  His last PET scan in 2018 was stable.  I called Dr. Lavera Guise who reviewed the case and recommended that we get an interventional radiologist to perform a core biopsy of the lymph node.  I have called radiology to try to set this up.  Patient understands this.  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 ([employer]) 03/26/2020    Past Surgical History:  Procedure Laterality Date   AXILLARY LYMPH NODE BIOPSY  2004   right   BACK SURGERY     lumbar lam   CHOLECYSTECTOMY     FINGER ARTHRODESIS Right 09/18/2014   Procedure: FUSION METACARPAL PHALANGEAL LEFT THUMB;  Surgeon: Daryll Brod, MD;  Location: [facility];  Service: Orthopedics;  Laterality: Right;   INNER EAR SURGERY     Removal of mass behind eardrum   KNEE ARTHROSCOPY     right and left   OTHER SURGICAL HISTORY     Removal of mass behind eardrum     Family History  Problem Relation Age of Onset   Osteoarthritis Father    Migraines Sister     Social History   Socioeconomic History   Marital status: Divorced    Spouse name: Not on file   Number of children: 1   Years of education: Not on file   Highest education level: Not on file  Occupational History   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  Tobacco Use   Smoking status: Never   Smokeless tobacco: Never  Substance and Sexual Activity   Alcohol use: Not Currently    Comment: hx abuse-been sober 2013   Drug use: Never   Sexual activity: Yes    Partners: Female  Other Topics Concern   Not on file  Social History Narrative   Not on file   Social Determinants of Health   Financial Resource Strain: Not on file  Food Insecurity: Not on file  Transportation Needs: Not on file  Physical Activity: Not on file  Stress: Not on file  Social Connections: Not on file  Intimate Partner Violence: Not on file    Outpatient Medications Prior to Visit  Medication Sig Dispense Refill   sildenafil (REVATIO) 20 MG tablet TAKE ONE TABLET BY MOUTH 3 TIMES DAILY 100 tablet 6   Ascorbic Acid (VITAMIN C) 100 MG tablet Take by mouth.     cetirizine (ZYRTEC) 10 MG tablet Take 10 mg by mouth daily.     ondansetron (ZOFRAN-ODT) 4 MG disintegrating tablet Take by mouth.     traMADol (ULTRAM) 50 MG tablet Take 1 tablet (50 mg total) by mouth every 6 (six) hours as needed. 15 tablet 0   No facility-administered medications prior to visit.  Allergies  Allergen Reactions   Oxycodone Hives    ROS Review of Systems  Constitutional:  Negative for chills, fatigue and fever.  HENT:  Negative for congestion, ear pain and sore throat.   Respiratory:  Negative for cough and shortness of breath.   Cardiovascular:  Negative for chest pain.  Gastrointestinal:  Negative for abdominal pain, constipation, diarrhea, nausea and vomiting.  Endocrine: Negative for polydipsia, polyphagia and polyuria.  Genitourinary:  Negative for dysuria and frequency.  Musculoskeletal:  Negative for arthralgias and myalgias.  Neurological:  Negative for dizziness and headaches.  Psychiatric/Behavioral:  Negative for dysphoric mood.        No dysphoria     Objective:    Physical Exam Vitals reviewed.  Constitutional:      Appearance: Normal appearance.   HENT:     Right Ear: Tympanic membrane normal.     Left Ear: Tympanic membrane normal.  Eyes:     Extraocular Movements: Extraocular movements intact.     Conjunctiva/sclera: Conjunctivae normal.     Pupils: Pupils are equal, round, and reactive to light.  Cardiovascular:     Rate and Rhythm: Normal rate and regular rhythm.     Pulses: Normal pulses.     Heart sounds: Normal heart sounds. No murmur heard.   No gallop.  Pulmonary:     Effort: Pulmonary effort is normal. No respiratory distress.     Breath sounds: Normal breath sounds. No wheezing.  Abdominal:     General: Abdomen is flat. Bowel sounds are normal.     Comments: Node on right side inguinal area  Genitourinary:    Comments: Tender right inguinal node, needs biposy with history of lymphoma Musculoskeletal:        General: Normal range of motion.     Cervical back: Normal range of motion and neck supple.  Skin:    General: Skin is warm.     Capillary Refill: Capillary refill takes less than 2 seconds.  Neurological:     General: No focal deficit present.     Mental Status: He is alert and oriented to person, place, and time. Mental status is at baseline.    BP 118/70   Pulse 98   Temp 98.1 F (36.7 C)   Resp 16   Ht '5\' 4"'$  (1.626 m)   Wt 193 lb (87.5 kg)   SpO2 98%   BMI 33.13 kg/m  Wt Readings from Last 3 Encounters:  02/11/21 193 lb (87.5 kg)  03/26/20 203 lb (92.1 kg)  09/18/14 173 lb (78.5 kg)     Health Maintenance Due  Topic Date Due   COVID-19 Vaccine (1) Never done   Pneumococcal Vaccine 38-27 Years old (1 - PCV) Never done   HIV Screening  Never done   Hepatitis C Screening  Never done   COLONOSCOPY (Pts 45-61yr Insurance coverage will need to be confirmed)  Never done   INFLUENZA VACCINE  01/13/2021    There are no preventive care reminders to display for this patient.  No results found for: TSH Lab Results  Component Value Date   WBC 6.3 03/26/2020   HGB 15.0 03/26/2020   HCT  45.7 03/26/2020   MCV 89 03/26/2020   PLT 252 03/26/2020   Lab Results  Component Value Date   NA 140 03/26/2020   K 4.7 03/26/2020   CO2 26 03/26/2020   GLUCOSE 91 03/26/2020   BUN 13 03/26/2020   CREATININE 1.18 03/26/2020   BILITOT 0.3 03/26/2020  ALKPHOS 75 03/26/2020   AST 15 03/26/2020   ALT 20 03/26/2020   PROT 6.6 03/26/2020   ALBUMIN 4.7 03/26/2020   CALCIUM 10.1 03/26/2020   Lab Results  Component Value Date   CHOL 196 03/26/2020   Lab Results  Component Value Date   HDL 44 03/26/2020   Lab Results  Component Value Date   LDLCALC 133 (H) 03/26/2020   Lab Results  Component Value Date   TRIG 102 03/26/2020   Lab Results  Component Value Date   CHOLHDL 4.5 03/26/2020   No results found for: HGBA1C    Assessment & Plan:   Problem List Items Addressed This Visit       Other   Follicular lymphoma ([place])    Diagnosed in 2005 and treated [facility] Dr[facility] Hinton Rao I discussed with Dr.Lewis and we will get intervention radiologist to do core biopsy         Follow-up: Return in about 2 weeks (around 02/25/2021).    Reinaldo Meeker, MD

## 2021-02-12 ENCOUNTER — Other Ambulatory Visit: Payer: Self-pay

## 2021-02-12 DIAGNOSIS — R590 Localized enlarged lymph nodes: Secondary | ICD-10-CM

## 2021-02-12 DIAGNOSIS — C8290 Follicular lymphoma, unspecified, unspecified site: Secondary | ICD-10-CM

## 2021-02-12 DIAGNOSIS — C8590 Non-Hodgkin lymphoma, unspecified, unspecified site: Secondary | ICD-10-CM

## 2021-02-12 DIAGNOSIS — C8599 Non-Hodgkin lymphoma, unspecified, extranodal and solid organ sites: Secondary | ICD-10-CM

## 2021-02-19 ENCOUNTER — Ambulatory Visit
Admission: RE | Admit: 2021-02-19 | Discharge: 2021-02-19 | Disposition: A | Discharge status: Home / Self Care | Payer: BC Managed Care – PPO | Source: Ambulatory Visit | Attending: Legal Medicine | Admitting: Legal Medicine

## 2021-02-19 DIAGNOSIS — C8590 Non-Hodgkin lymphoma, unspecified, unspecified site: Secondary | ICD-10-CM

## 2021-02-19 DIAGNOSIS — Z8572 Personal history of non-Hodgkin lymphomas: Secondary | ICD-10-CM | POA: Diagnosis not present

## 2021-02-19 DIAGNOSIS — R590 Localized enlarged lymph nodes: Secondary | ICD-10-CM

## 2021-02-21 NOTE — Progress Notes (Signed)
Ultrasound negative for enlarged nodes, we will watch lp

## 2021-02-27 ENCOUNTER — Telehealth: Payer: Self-pay

## 2021-02-27 NOTE — Telephone Encounter (Signed)
Do not see if anything has been scheduled in [place] or Epic.

## 2021-02-27 NOTE — Telephone Encounter (Signed)
-----   Message from Derwood Kaplan, MD sent at 02/21/2021  9:35 AM EDT ----- Regarding: path Two weeks ago, Dr. Bobby Rumpf spoke with PCP, Dr. Henrene Pastor, and rec he do core bx of new right inguinal node.  Pls find out if done, not sure with surgeon or IR

## 2021-03-24 DIAGNOSIS — M79642 Pain in left hand: Secondary | ICD-10-CM | POA: Diagnosis not present

## 2021-03-27 NOTE — Progress Notes (Signed)
[place]  [address] [place],  [place]  [address] [phone]  Clinic Day:  04/02/2021  Referring physician: Lillard Anes,*  This document serves as a record of services personally performed by Hosie Poisson, MD. It was created on their behalf by [facility], a trained medical scribe. The creation of this record is based on the scribe's personal observations and the provider's statements to them.  CHIEF COMPLAINT:  CC: History of stage I diffuse high grade B-cell lymphoma  Current Treatment:  Surveillance   HISTORY OF PRESENT ILLNESS:  Craig Davenport is a 50 y.o. 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  He was treated with chemotherapy with R-CHOP for 3 cycles, followed by involved field radiation, with a complete response.  He reported left axillary nodal swelling in January 2018, which was not painful, but just slightly tender.  He had night sweats at that time as well.  CT of chest, abdomen and pelvis revealed a 15 mm right deep inguinal node, with an additional 8 mm right external iliac node, both felt to be indeterminate.  He therefore had repeat CT scans in April 2018 to follow up on these abnormalities.  These revealed bilateral pelvic lymphadenopathy, which had increased since and was worrisome for recurrent lymphoma.  The right external iliac node had increased from 1.5 to 1.9 cm.  There was a new 1.1 cm left external iliac node, as well as 2 additional 9 mm nodes in the right external iliac and left common iliac areas.  PET scan revealed hypermetabolic pelvic adenopathy most striking in the 1.7 cm right external iliac lymph node.  The other enlarged pelvic lymph nodes demonstrated more low-grade hypermetabolic activity. There was a mildly hypermetabolic  subcarinal lymph node in the chest measuring 1.1 cm.  There was also soft tissue fullness along the posterior nasopharynx/adenoidal region, which was mildly hypermetabolic.   He underwent CT-guided biopsy of the right inguinal node, which revealed benign adipose tissue with smooth muscle and minute aggregates of small lymphocytes.  We referred him to [facility] to help pursue a diagnosis and he underwent excisional biopsy of the right inguinal lymph node, which was also benign.  We have therefore opted for close observation.  PET scan in November 2018 revealed most of the lymph nodes to be stable in size and hypermetabolic activity.  There was mildly increased hypermetabolic activity in a left cervical lymph node, left axillary lymph node and left iliac lymph node.  There was a decrease in the subcarinal lymph node hypermetabolism, as well as a decrease in the posterior nasopharyngeal hypermetabolism.  The patient remains on observation.  He has had his COVID vaccines.  INTERVAL HISTORY:  Craig Davenport is here for follow up after not being seen since March 2021. He saw Dr. Henrene Pastor in August 2022 with reported painful enlarged right inguinal lymph node. Ultrasound of the right soft tissue groin from September revealed a negative exam with no evidence of pathologic right inguinal lymphadenopathy, the largest node was up to 6 mm, normal size, or right inguinal hernia. He denies fever, chills or night sweats. He denies any other evidence of adenopathy. He states that he has been well since his last visit. As Dr. Henrene Pastor did not obtain labs in August, we will draw CBC, CMP and LDH today.  His  appetite is good, and his weight is stable since his last visit. He denies nausea, vomiting, bowel issues, or abdominal pain.  He denies sore throat, cough, dyspnea, or chest pain.  REVIEW OF SYSTEMS:  Review of Systems  Constitutional: Negative.  Negative for appetite change, chills, fatigue, fever and unexpected weight change.   HENT:  Negative.    Eyes: Negative.   Respiratory: Negative.  Negative for chest tightness, cough, hemoptysis, shortness of breath and wheezing.   Cardiovascular: Negative.  Negative for chest pain, leg swelling and palpitations.  Gastrointestinal: Negative.  Negative for abdominal distention, abdominal pain, blood in stool, constipation, diarrhea, nausea and vomiting.  Endocrine: Negative.   Genitourinary: Negative.  Negative for difficulty urinating, dysuria, frequency and hematuria.   Musculoskeletal: Negative.  Negative for arthralgias, back pain, flank pain, gait problem and myalgias.  Skin: Negative.   Neurological: Negative.  Negative for dizziness, extremity weakness, gait problem, headaches, light-headedness, numbness, seizures and speech difficulty.  Hematological: Negative.   Psychiatric/Behavioral: Negative.  Negative for depression and sleep disturbance. The patient is not nervous/anxious.   All other systems reviewed and are negative.  He has had an ingrown toenail of the right foot VITALS:  Blood pressure 135/82, pulse 83, temperature 98.3 F (36.8 C), temperature source Oral, resp. rate 18, height 5\' 4"  (1.626 m), weight 199 lb 1.6 oz (90.3 kg), SpO2 98 %.  Wt Readings from Last 3 Encounters:  04/02/21 199 lb 1.6 oz (90.3 kg)  02/11/21 193 lb (87.5 kg)  03/26/20 203 lb (92.1 kg)    Body mass index is 34.18 kg/m.  Performance status (ECOG): 0 - Asymptomatic  PHYSICAL EXAM:  Physical Exam Constitutional:      General: He is not in acute distress.    Appearance: Normal appearance. He is normal weight.  HENT:     Head: Normocephalic and atraumatic.  Eyes:     General: No scleral icterus.    Extraocular Movements: Extraocular movements intact.     Conjunctiva/sclera: Conjunctivae normal.     Pupils: Pupils are equal, round, and reactive to light.  Cardiovascular:     Rate and Rhythm: Normal rate and regular rhythm.     Pulses: Normal pulses.     Heart sounds:  Normal heart sounds. No murmur heard.   No friction rub. No gallop.  Pulmonary:     Effort: Pulmonary effort is normal. No respiratory distress.     Breath sounds: Normal breath sounds.  Abdominal:     General: Bowel sounds are normal. There is no distension.     Palpations: Abdomen is soft. There is no hepatomegaly, splenomegaly or mass.     Tenderness: There is no abdominal tenderness.  Musculoskeletal:        General: Normal range of motion.     Cervical back: Normal range of motion and neck supple.     Right lower leg: No edema.     Left lower leg: No edema.  Lymphadenopathy:     Cervical: No cervical adenopathy.     Lower Body: Right inguinal adenopathy (at least 1 node, measuring at most 1 cm, which is tender) present.  Skin:    General: Skin is warm and dry.  Neurological:     General: No focal deficit present.     Mental Status: He is alert and oriented to person, place, and time. Mental status is at baseline.  Psychiatric:        Mood and Affect: Mood normal.  Behavior: Behavior normal.        Thought Content: Thought content normal.        Judgment: Judgment normal.    LABS:   CBC Latest Ref Rng & Units 03/26/2020 09/18/2014 04/30/2013  WBC 3.4 - 10.8 x10E3/uL 6.3 - 7.5  Hemoglobin 13.0 - 17.7 g/dL 15.0 14.8 13.9  Hematocrit 37.5 - 51.0 % 45.7 - 41.3  Platelets 150 - 450 x10E3/uL 252 - 220   CMP Latest Ref Rng & Units 03/26/2020 04/30/2013 04/29/2013  Glucose 65 - 99 mg/dL 91 102(H) 103(H)  BUN 6 - 24 mg/dL 13 11 12   Creatinine 0.76 - 1.27 mg/dL 1.18 0.88 0.92  Sodium 134 - 144 mmol/L 140 139 137  Potassium 3.5 - 5.2 mmol/L 4.7 4.2 4.3  Chloride 96 - 106 mmol/L 101 104 102  CO2 20 - 29 mmol/L 26 24 25   Calcium 8.7 - 10.2 mg/dL 10.1 9.1 9.1  Total Protein 6.0 - 8.5 g/dL 6.6 - -  Total Bilirubin 0.0 - 1.2 mg/dL 0.3 - -  Alkaline Phos 44 - 121 IU/L 75 - -  AST 0 - 40 IU/L 15 - -  ALT 0 - 44 IU/L 20 - -    No results found for: LDH  STUDIES:  No  results found.   EXAM: 02/19/2021 ULTRASOUND OF RIGHT GROIN SOFT TISSUES   TECHNIQUE: Ultrasound examination of the groin soft tissues was performed in the area of clinical concern.   COMPARISON:  11/24/2017   FINDINGS: Targeted ultrasound was performed of the soft tissues of the right groin at site of patient's clinical concern. There are a few small morphologically normal appearing lymph nodes measuring up to 6 mm short axis. No enlarged lymph nodes or soft tissue mass. No soft tissue edema or fluid collection. No inguinal hernia identified on rest and Valsalva imaging.   IMPRESSION: Negative exam. No evidence of right inguinal lymphadenopathy or right inguinal hernia.  HISTORY:   Allergies:  Allergies  Allergen Reactions   Oxycodone Hives    Current Medications: Current Outpatient Medications  Medication Sig Dispense Refill   sildenafil (REVATIO) 20 MG tablet TAKE ONE TABLET BY MOUTH 3 TIMES DAILY 100 tablet 6   No current facility-administered medications for this visit.     ASSESSMENT & PLAN:   Assessment/Plan:  Craig Davenport is a 50 y.o. 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  He was treated with chemotherapy and radiation.  He developed right iliac adenopathy in April 2018 and excisional biopsy did not reveal any evidence of lymphoma or other malignancy.  There were stable, persistent external iliac nodes with mild metabolic activity on PET in June 2019.  He recently had recurrent enlarged and tender right inguinal lymph node in August 2022, which resolved by September with negative ultrasound. I think this was inflammatory in nature and so we do not need to pursue scans or further aggressive evaluation. He has no palpable adenopathy at this time. As he has not had recent lab work, I will order CBC, CMP and LDH and will call him with the results. Otherwise, we will see him back in 1  year with CBC, CMP, and LDH.  The patient understands the plans discussed today and is in agreement with them.  He knows to contact our office if he develops concerns regarding his lymphoma.   I provided 15 minutes of face-to-face time during this this encounter and >  50% was spent counseling as documented under my assessment and plan.    Derwood Kaplan, MD [facility] AT [facility] [address] [place] [address] Dept: [phone] Dept Fax: [phone]   I, Rita Ohara, am acting as scribe for Derwood Kaplan, MD  I have reviewed this report as typed by the medical scribe, and it is complete and accurate.  Hermina Barters

## 2021-04-02 ENCOUNTER — Inpatient Hospital Stay: Payer: BC Managed Care – PPO

## 2021-04-02 ENCOUNTER — Inpatient Hospital Stay: Payer: BC Managed Care – PPO | Attending: Oncology | Admitting: Oncology

## 2021-04-02 ENCOUNTER — Encounter: Payer: Self-pay | Admitting: Oncology

## 2021-04-02 ENCOUNTER — Other Ambulatory Visit: Payer: Self-pay | Admitting: Hematology and Oncology

## 2021-04-02 ENCOUNTER — Other Ambulatory Visit: Payer: Self-pay | Admitting: Oncology

## 2021-04-02 ENCOUNTER — Telehealth: Payer: Self-pay | Admitting: Oncology

## 2021-04-02 DIAGNOSIS — C8580 Other specified types of non-Hodgkin lymphoma, unspecified site: Secondary | ICD-10-CM | POA: Diagnosis not present

## 2021-04-02 DIAGNOSIS — Z923 Personal history of irradiation: Secondary | ICD-10-CM | POA: Insufficient documentation

## 2021-04-02 DIAGNOSIS — Z8579 Personal history of other malignant neoplasms of lymphoid, hematopoietic and related tissues: Secondary | ICD-10-CM | POA: Diagnosis not present

## 2021-04-02 DIAGNOSIS — Z9221 Personal history of antineoplastic chemotherapy: Secondary | ICD-10-CM | POA: Insufficient documentation

## 2021-04-02 DIAGNOSIS — C8599 Non-Hodgkin lymphoma, unspecified, extranodal and solid organ sites: Secondary | ICD-10-CM

## 2021-04-02 LAB — BASIC METABOLIC PANEL
BUN: 15 (ref 4–21)
CO2: 28 — AB (ref 13–22)
Chloride: 105 (ref 99–108)
Creatinine: 1.2 (ref 0.6–1.3)
Glucose: 80
Potassium: 4.2 (ref 3.4–5.3)
Sodium: 140 (ref 137–147)

## 2021-04-02 LAB — LACTATE DEHYDROGENASE: LDH: 119 U/L (ref 98–192)

## 2021-04-02 LAB — CBC AND DIFFERENTIAL
HCT: 44 (ref 41–53)
Hemoglobin: 14.8 (ref 13.5–17.5)
Neutrophils Absolute: 4.55
Platelets: 235 (ref 150–399)
WBC: 7

## 2021-04-02 LAB — CBC: RBC: 5.06 (ref 3.87–5.11)

## 2021-04-02 LAB — HEPATIC FUNCTION PANEL
ALT: 18 (ref 10–40)
AST: 27 (ref 14–40)
Alkaline Phosphatase: 76 (ref 25–125)
Bilirubin, Total: 0.6

## 2021-04-02 LAB — COMPREHENSIVE METABOLIC PANEL
Albumin: 4.5 (ref 3.5–5.0)
Calcium: 9 (ref 8.7–10.7)

## 2021-04-02 NOTE — Telephone Encounter (Signed)
Per 10/19 los next appt scheduled and given to patient 

## 2021-04-02 NOTE — Progress Notes (Deleted)
HEMATOLOGY-ONCOLOGY ELECTRONIC VISIT PROGRESS NOTE  Patient Care Team: Lillard Anes, MD as PCP - General (Family Medicine)  I connected with the patient via telephone conference and verified that I am speaking with the correct person using two identifiers. The patient's location is at home and I am providing care from the [facility] I discussed the limitations, risks, security and privacy concerns of performing an evaluation and management service by e-visits and the availability of in person appointments.  I also discussed with the patient that there may be a patient responsible charge related to this service. The patient expressed understanding and agreed to proceed.   ASSESSMENT & PLAN:  No problem-specific Assessment & Plan notes found for this encounter.   No orders of the defined types were placed in this encounter.   INTERVAL HISTORY: Please see below for problem oriented charting. The purpose of today's discussion is ***   SUMMARY OF ONCOLOGIC HISTORY: Oncology History   No history exists.    REVIEW OF SYSTEMS:   Constitutional: Denies fevers, chills or abnormal weight loss Eyes: Denies blurriness of vision Ears, nose, mouth, throat, and face: Denies mucositis or sore throat Respiratory: Denies cough, dyspnea or wheezes Cardiovascular: Denies palpitation, chest discomfort Gastrointestinal:  Denies nausea, heartburn or change in bowel habits Skin: Denies abnormal skin rashes Lymphatics: Denies new lymphadenopathy or easy bruising Neurological:Denies numbness, tingling or new weaknesses Behavioral/Psych: Mood is stable, no new changes  Extremities: No lower extremity edema All other systems were reviewed with the patient and are negative.  I have reviewed the past medical history, past surgical history, social history and family history with the patient and they are unchanged from previous note.  ALLERGIES:  is allergic to oxycodone.  MEDICATIONS:   Current Outpatient Medications  Medication Sig Dispense Refill   sildenafil (REVATIO) 20 MG tablet TAKE ONE TABLET BY MOUTH 3 TIMES DAILY 100 tablet 6   No current facility-administered medications for this visit.    PHYSICAL EXAMINATION: ECOG PERFORMANCE STATUS: {CHL ONC ECOG [id number]  LABORATORY DATA:  I have reviewed the data as listed CMP Latest Ref Rng & Units 03/26/2020 04/30/2013 04/29/2013  Glucose 65 - 99 mg/dL 91 102(H) 103(H)  BUN 6 - 24 mg/dL 13 11 12   Creatinine 0.76 - 1.27 mg/dL 1.18 0.88 0.92  Sodium 134 - 144 mmol/L 140 139 137  Potassium 3.5 - 5.2 mmol/L 4.7 4.2 4.3  Chloride 96 - 106 mmol/L 101 104 102  CO2 20 - 29 mmol/L 26 24 25   Calcium 8.7 - 10.2 mg/dL 10.1 9.1 9.1  Total Protein 6.0 - 8.5 g/dL 6.6 - -  Total Bilirubin 0.0 - 1.2 mg/dL 0.3 - -  Alkaline Phos 44 - 121 IU/L 75 - -  AST 0 - 40 IU/L 15 - -  ALT 0 - 44 IU/L 20 - -    Lab Results  Component Value Date   WBC 6.3 03/26/2020   HGB 15.0 03/26/2020   HCT 45.7 03/26/2020   MCV 89 03/26/2020   PLT 252 03/26/2020   NEUTROABS 3.9 03/26/2020     RADIOGRAPHIC STUDIES: I have personally reviewed the radiological images as listed and agreed with the findings in the report. No results found.  I discussed the assessment and treatment plan with the patient. The patient was provided an opportunity to ask questions and all were answered. The patient agreed with the plan and demonstrated an understanding of the instructions. The patient was advised to call back or seek an  in-person evaluation if the symptoms worsen or if the condition fails to improve as anticipated.    I spent {CHL ONC TIME VISIT - [id number]} for the appointment reviewing test results, discuss management and coordination of care.  Derwood Kaplan, MD 04/02/2021 9:31 AM

## 2021-04-02 NOTE — Progress Notes (Deleted)
HEMATOLOGY-ONCOLOGY ELECTRONIC VISIT PROGRESS NOTE  Patient Care Team: Lillard Anes, MD as PCP - General (Family Medicine)  I connected with the patient via telephone conference and verified that I am speaking with the correct person using two identifiers. The patient's location is at home and I am providing care from the [facility] I discussed the limitations, risks, security and privacy concerns of performing an evaluation and management service by e-visits and the availability of in person appointments.  I also discussed with the patient that there may be a patient responsible charge related to this service. The patient expressed understanding and agreed to proceed.   ASSESSMENT & PLAN:  No problem-specific Assessment & Plan notes found for this encounter.   No orders of the defined types were placed in this encounter.   INTERVAL HISTORY: Please see below for problem oriented charting. The purpose of today's discussion is ***   SUMMARY OF ONCOLOGIC HISTORY: Oncology History   No history exists.    REVIEW OF SYSTEMS:   Constitutional: Denies fevers, chills or abnormal weight loss Eyes: Denies blurriness of vision Ears, nose, mouth, throat, and face: Denies mucositis or sore throat Respiratory: Denies cough, dyspnea or wheezes Cardiovascular: Denies palpitation, chest discomfort Gastrointestinal:  Denies nausea, heartburn or change in bowel habits Skin: Denies abnormal skin rashes Lymphatics: Denies new lymphadenopathy or easy bruising Neurological:Denies numbness, tingling or new weaknesses Behavioral/Psych: Mood is stable, no new changes  Extremities: No lower extremity edema All other systems were reviewed with the patient and are negative.  I have reviewed the past medical history, past surgical history, social history and family history with the patient and they are unchanged from previous note.  ALLERGIES:  is allergic to oxycodone.  MEDICATIONS:   Current Outpatient Medications  Medication Sig Dispense Refill   sildenafil (REVATIO) 20 MG tablet TAKE ONE TABLET BY MOUTH 3 TIMES DAILY 100 tablet 6   No current facility-administered medications for this visit.    PHYSICAL EXAMINATION: ECOG PERFORMANCE STATUS: {CHL ONC ECOG [id number]  LABORATORY DATA:  I have reviewed the data as listed CMP Latest Ref Rng & Units 03/26/2020 04/30/2013 04/29/2013  Glucose 65 - 99 mg/dL 91 102(H) 103(H)  BUN 6 - 24 mg/dL 13 11 12   Creatinine 0.76 - 1.27 mg/dL 1.18 0.88 0.92  Sodium 134 - 144 mmol/L 140 139 137  Potassium 3.5 - 5.2 mmol/L 4.7 4.2 4.3  Chloride 96 - 106 mmol/L 101 104 102  CO2 20 - 29 mmol/L 26 24 25   Calcium 8.7 - 10.2 mg/dL 10.1 9.1 9.1  Total Protein 6.0 - 8.5 g/dL 6.6 - -  Total Bilirubin 0.0 - 1.2 mg/dL 0.3 - -  Alkaline Phos 44 - 121 IU/L 75 - -  AST 0 - 40 IU/L 15 - -  ALT 0 - 44 IU/L 20 - -    Lab Results  Component Value Date   WBC 6.3 03/26/2020   HGB 15.0 03/26/2020   HCT 45.7 03/26/2020   MCV 89 03/26/2020   PLT 252 03/26/2020   NEUTROABS 3.9 03/26/2020     RADIOGRAPHIC STUDIES: I have personally reviewed the radiological images as listed and agreed with the findings in the report. No results found.  I discussed the assessment and treatment plan with the patient. The patient was provided an opportunity to ask questions and all were answered. The patient agreed with the plan and demonstrated an understanding of the instructions. The patient was advised to call back or seek an  in-person evaluation if the symptoms worsen or if the condition fails to improve as anticipated.    I spent {CHL ONC TIME VISIT - [id number]} for the appointment reviewing test results, discuss management and coordination of care.  Derwood Kaplan, MD 04/02/2021 9:27 AM

## 2021-04-03 ENCOUNTER — Telehealth: Payer: Self-pay

## 2021-04-03 NOTE — Telephone Encounter (Signed)
-----   Message from Derwood Kaplan, MD sent at 04/02/2021  1:02 PM EDT ----- Regarding: call pt Tell him labs all  look great

## 2021-04-07 ENCOUNTER — Telehealth: Payer: Self-pay

## 2021-04-07 NOTE — Telephone Encounter (Signed)
Pt called earlier and asked that we leave the message on his voice mail, if he doesn't pick it up.  I called him back, he didn't answer. I LVM on identified voicemail that his labs look great per Dr Hinton Rao, as pt requested.    Belva Chimes, LPN   Licensed Practical Nurse    Telephone Encounter  Signed  Encounter Date:  04/03/2021           Signed        Show:Clear all [] Written[] Templated[x] Copied  Added by: [x] Belva Chimes, LPN  [] Hover for details          ----- Message from Derwood Kaplan, MD sent at 04/02/2021  1:02 PM EDT ----- Regarding: call pt Tell him labs all  look great

## 2021-07-04 ENCOUNTER — Ambulatory Visit: Payer: BC Managed Care – PPO | Admitting: Family Medicine

## 2021-07-04 ENCOUNTER — Encounter: Payer: Self-pay | Admitting: Legal Medicine

## 2021-07-04 VITALS — BP 110/74 | HR 72 | Temp 97.2°F | Resp 18 | Wt 202.0 lb

## 2021-07-04 DIAGNOSIS — J020 Streptococcal pharyngitis: Secondary | ICD-10-CM | POA: Diagnosis not present

## 2021-07-04 DIAGNOSIS — R051 Acute cough: Secondary | ICD-10-CM | POA: Diagnosis not present

## 2021-07-04 LAB — POCT RAPID STREP A (OFFICE): Rapid Strep A Screen: NEGATIVE

## 2021-07-04 LAB — POC COVID19 BINAXNOW: SARS Coronavirus 2 Ag: NEGATIVE

## 2021-07-04 MED ORDER — AMOXICILLIN-POT CLAVULANATE 875-125 MG PO TABS: 1.0000 | ORAL_TABLET | Freq: Two times a day (BID) | ORAL | 0 refills | Status: DC

## 2021-07-04 NOTE — Progress Notes (Signed)
Acute Office Visit  Subjective:    Patient ID: Craig Davenport, male    DOB: February 03, 1971, 51 y.o.   MRN: [id number]  Chief Complaint  Patient presents with   Cough   Headache   Fatigue    HPI: Patient is in today for fatigue, sore throat, cough, sob, and headaches x 1 week. Takeing allergy and pain relief. Cough drops. Covid 19 negative.    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 ([facility]) 03/26/2020    Past Surgical History:  Procedure Laterality Date   AXILLARY LYMPH NODE BIOPSY  2004   right   BACK SURGERY     lumbar lam   CHOLECYSTECTOMY     FINGER ARTHRODESIS Right 09/18/2014   Procedure: FUSION METACARPAL PHALANGEAL LEFT THUMB;  Surgeon: Daryll Brod, MD;  Location: [place];  Service: Orthopedics;  Laterality: Right;   INNER EAR SURGERY     Removal of mass behind eardrum   KNEE ARTHROSCOPY     right and left   OTHER SURGICAL HISTORY     Removal of mass behind eardrum     Family History  Problem Relation Age of Onset   Osteoarthritis Father    Migraines Sister     Social History   Socioeconomic History   Marital status: Married    Spouse name: Not on file   Number of children: 1   Years of education: Not on file   Highest education level: Not on file  Occupational History   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  Tobacco Use   Smoking status: Never   Smokeless tobacco: Never  Substance and Sexual Activity   Alcohol use: Not Currently    Comment: hx abuse-been sober 2013   Drug use: Never   Sexual activity: Yes    Partners: Female  Other Topics Concern   Not on file  Social History Narrative   Not on file   Social Determinants of Health   Financial Resource Strain: Not on  file  Food Insecurity: Not on file  Transportation Needs: Not on file  Physical Activity: Not on file  Stress: Not on file  Social Connections: Not on file  Intimate Partner Violence: Not on file    Outpatient Medications Prior to Visit  Medication Sig Dispense Refill   sildenafil (REVATIO) 20 MG tablet TAKE ONE TABLET BY MOUTH 3 TIMES DAILY 100 tablet 6   No facility-administered medications prior to visit.    Allergies  Allergen Reactions   Oxycodone Hives    Review of Systems  Constitutional:  Positive for fatigue. Negative for chills and fever.  HENT:  Positive for sore throat.   Respiratory:  Positive for cough and shortness of breath.   Neurological:  Positive for headaches.      Objective:    Physical Exam Vitals reviewed.  Constitutional:      Appearance: Normal appearance. He is well-developed.  HENT:     Right Ear: Tympanic membrane, ear canal and external ear normal.     Left Ear: Tympanic membrane, ear canal and external ear normal.     Nose: Nose normal. No congestion or rhinorrhea.     Comments: Sinus tenderness     Mouth/Throat:  Mouth: Mucous membranes are moist.     Pharynx: Posterior oropharyngeal erythema present.  Cardiovascular:     Rate and Rhythm: Normal rate and regular rhythm.     Heart sounds: Normal heart sounds.  Pulmonary:     Effort: Pulmonary effort is normal. No respiratory distress.     Breath sounds: Normal breath sounds. No wheezing, rhonchi or rales.  Abdominal:     General: Bowel sounds are normal.     Palpations: Abdomen is soft.     Tenderness: There is no abdominal tenderness.  Lymphadenopathy:     Cervical: Cervical adenopathy present.  Neurological:     Mental Status: He is alert and oriented to person, place, and time.  Psychiatric:        Mood and Affect: Mood normal.        Behavior: Behavior normal.    BP 110/74    Pulse 72    Temp (!) 97.2 F (36.2 C)    Resp 18    Wt 202 lb (91.6 kg)    SpO2 96%    BMI  34.67 kg/m  Wt Readings from Last 3 Encounters:  07/04/21 202 lb (91.6 kg)  04/02/21 199 lb 1.6 oz (90.3 kg)  02/11/21 193 lb (87.5 kg)    Health Maintenance Due  Topic Date Due   COVID-19 Vaccine (1) Never done   HIV Screening  Never done   Hepatitis C Screening  Never done   COLONOSCOPY (Pts 45-76yrs Insurance coverage will need to be confirmed)  Never done   INFLUENZA VACCINE  01/13/2021    There are no preventive care reminders to display for this patient.   No results found for: TSH Lab Results  Component Value Date   WBC 7.0 04/02/2021   HGB 14.8 04/02/2021   HCT 44 04/02/2021   MCV 89 03/26/2020   PLT 235 04/02/2021   Lab Results  Component Value Date   NA 140 04/02/2021   K 4.2 04/02/2021   CO2 28 (A) 04/02/2021   GLUCOSE 91 03/26/2020   BUN 15 04/02/2021   CREATININE 1.2 04/02/2021   BILITOT 0.3 03/26/2020   ALKPHOS 76 04/02/2021   AST 27 04/02/2021   ALT 18 04/02/2021   PROT 6.6 03/26/2020   ALBUMIN 4.5 04/02/2021   CALCIUM 9.0 04/02/2021   Lab Results  Component Value Date   CHOL 196 03/26/2020   Lab Results  Component Value Date   HDL 44 03/26/2020   Lab Results  Component Value Date   LDLCALC 133 (H) 03/26/2020   Lab Results  Component Value Date   TRIG 102 03/26/2020   Lab Results  Component Value Date   CHOLHDL 4.5 03/26/2020   No results found for: HGBA1C     Assessment & Plan:   Problem List Items Addressed This Visit       Respiratory   Acute streptococcal pharyngitis    rx augmentin.  Gargle warm salt water      Relevant Orders   POCT rapid strep A (Completed)     Other   Acute cough - Primary    otc cough medicines recommended.      Relevant Orders   POC COVID-19 (Completed)   Meds ordered this encounter  Medications   amoxicillin-clavulanate (AUGMENTIN) 875-125 MG tablet    Sig: Take 1 tablet by mouth 2 (two) times daily.    Dispense:  20 tablet    Refill:  0    Orders Placed This Encounter  Procedures   POC COVID-19   POCT rapid strep A     Follow-up: No follow-ups on file.  An After Visit Summary was printed and given to the patient.  Rochel Brome, MD [facility] [phone]

## 2021-07-06 ENCOUNTER — Encounter: Payer: Self-pay | Admitting: Family Medicine

## 2021-07-06 DIAGNOSIS — R051 Acute cough: Secondary | ICD-10-CM | POA: Insufficient documentation

## 2021-07-06 DIAGNOSIS — J020 Streptococcal pharyngitis: Secondary | ICD-10-CM | POA: Insufficient documentation

## 2021-07-06 NOTE — Assessment & Plan Note (Signed)
otc cough medicines recommended.

## 2021-07-06 NOTE — Assessment & Plan Note (Signed)
rx augmentin.  Gargle warm salt water

## 2021-08-20 DIAGNOSIS — R519 Headache, unspecified: Secondary | ICD-10-CM | POA: Diagnosis not present

## 2021-08-20 DIAGNOSIS — J0100 Acute maxillary sinusitis, unspecified: Secondary | ICD-10-CM | POA: Diagnosis not present

## 2021-08-20 DIAGNOSIS — Z20828 Contact with and (suspected) exposure to other viral communicable diseases: Secondary | ICD-10-CM | POA: Diagnosis not present

## 2021-08-20 DIAGNOSIS — M542 Cervicalgia: Secondary | ICD-10-CM | POA: Diagnosis not present

## 2021-09-10 ENCOUNTER — Other Ambulatory Visit: Payer: Self-pay | Admitting: Legal Medicine

## 2021-09-10 DIAGNOSIS — N529 Male erectile dysfunction, unspecified: Secondary | ICD-10-CM

## 2021-10-06 ENCOUNTER — Telehealth: Payer: Self-pay

## 2021-10-06 NOTE — Telephone Encounter (Signed)
I left message on voicemail to call us back to set up an appointment fasting. ?

## 2022-04-01 ENCOUNTER — Inpatient Hospital Stay: Payer: BC Managed Care – PPO

## 2022-04-01 ENCOUNTER — Ambulatory Visit: Payer: BC Managed Care – PPO | Admitting: Hematology and Oncology

## 2022-04-02 ENCOUNTER — Ambulatory Visit: Payer: Self-pay | Admitting: Hematology and Oncology

## 2022-04-02 ENCOUNTER — Other Ambulatory Visit: Payer: BC Managed Care – PPO

## 2022-06-02 DIAGNOSIS — J029 Acute pharyngitis, unspecified: Secondary | ICD-10-CM | POA: Diagnosis not present

## 2022-10-07 ENCOUNTER — Other Ambulatory Visit: Payer: Self-pay

## 2022-10-07 DIAGNOSIS — N529 Male erectile dysfunction, unspecified: Secondary | ICD-10-CM

## 2022-10-08 ENCOUNTER — Other Ambulatory Visit: Payer: Self-pay

## 2022-10-08 DIAGNOSIS — N529 Male erectile dysfunction, unspecified: Secondary | ICD-10-CM

## 2022-10-09 MED ORDER — SILDENAFIL CITRATE 20 MG PO TABS: 20.0000 mg | ORAL_TABLET | Freq: Three times a day (TID) | ORAL | 6 refills | Status: DC

## 2022-10-12 ENCOUNTER — Other Ambulatory Visit: Payer: Self-pay

## 2022-10-12 DIAGNOSIS — N529 Male erectile dysfunction, unspecified: Secondary | ICD-10-CM

## 2022-10-12 NOTE — Telephone Encounter (Signed)
Refill request for the sildalifil, the refill went to the wrong pharmacy and it is supposed to go to [facility] not [facility]. Please re-send RX to [facility] pharmacy.

## 2022-10-27 NOTE — Progress Notes (Signed)
Subjective:  Patient ID: Craig Davenport, male    DOB: [birth year]  Age: 52 y.o. MRN: [id number]  Chief Complaint  Patient presents with   Medical Management of Chronic Issues    HPI Patient comes in today for a yearly checkup.  Patient states that he has had no recent complaints no problems.  Patient states he feels he is doing well and healthy.  Patient denies any history of smoking.  Denies any.  Abdominal pain chest pain.  Worsening of acid reflux.  Patient states he usually just take his wife's acid reflux medicine, he is not sure what it is.  Discussed with him filling his own prescription if he needs it or if they start to run low I can send him his own prescription or he can get it over-the-counter depending on what medication he has.  Patient was interested in being referred for colonoscopy.      10/28/2022    7:36 AM 03/26/2020    9:28 AM  Depression screen PHQ 2/9  Decreased Interest 0 0  Down, Depressed, Hopeless 0 0  PHQ - 2 Score 0 0        10/28/2022    7:36 AM  Fall Risk   Falls in the past year? 0  Number falls in past yr: 0  Injury with Fall? 0  Risk for fall due to : No Fall Risks  Follow up Falls evaluation completed    Patient Care Team: Langley Gauss, [place] as PCP - General (Physician Assistant)   Review of Systems  Constitutional:  Negative for chills and fever.  HENT:  Negative for congestion and sore throat.   Respiratory:  Negative for cough.   Cardiovascular:  Negative for chest pain.  Gastrointestinal:  Negative for abdominal pain and nausea.  Genitourinary:  Negative for difficulty urinating, flank pain and urgency.  Musculoskeletal:  Negative for arthralgias.  Neurological:  Negative for dizziness and headaches.  Psychiatric/Behavioral:  The patient is not nervous/anxious.     Current Outpatient Medications on File Prior to Visit  Medication Sig Dispense Refill   sildenafil (REVATIO) 20 MG tablet Take 1 tablet (20 mg total) by mouth 3 (three)  times daily. 100 tablet 6   No current facility-administered medications on file prior to visit.   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   Past Surgical History:  Procedure Laterality Date   AXILLARY LYMPH NODE BIOPSY  2004   right   BACK SURGERY     lumbar lam   CHOLECYSTECTOMY     FINGER ARTHRODESIS Right 09/18/2014   Procedure: FUSION METACARPAL PHALANGEAL LEFT THUMB;  Surgeon: Cindee Salt, MD;  Location: [facility];  Service: Orthopedics;  Laterality: Right;   INNER EAR SURGERY     Removal of mass behind eardrum   KNEE ARTHROSCOPY     right and left   OTHER SURGICAL HISTORY     Removal of mass behind eardrum     Family History  Problem Relation Age of Onset   Osteoarthritis Father    Migraines Sister    Social History   Socioeconomic History   Marital status: Married    Spouse name: Not on file   Number  of children: 1   Years of education: Not on file   Highest education level: Not on file  Occupational History   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  Tobacco Use   Smoking status: Never   Smokeless tobacco: Never  Substance and Sexual Activity   Alcohol use: Not Currently    Comment: hx abuse-been sober 2013   Drug use: Never   Sexual activity: Yes    Partners: Female  Other Topics Concern   Not on file  Social History Narrative   Not on file   Social Determinants of Health   Financial Resource Strain: Not on file  Food Insecurity: Not on file  Transportation Needs: Not on file  Physical Activity: Not on file  Stress: Not on file  Social Connections: Not on file    Objective:  BP 116/80 (BP Location: Left Arm, Patient Position: Sitting, Cuff Size: Large)    Pulse 66   Temp (!) 97.1 F (36.2 C) (Temporal)   Ht 5\' 4"  (1.626 m)   Wt 195 lb (88.5 kg)   SpO2 99%   BMI 33.47 kg/m      10/28/2022    7:34 AM 07/04/2021    9:29 AM 04/02/2021    8:52 AM  BP/Weight  Systolic BP 116 110 135  Diastolic BP 80 74 82  Wt. (Lbs) 195 202 199.1  BMI 33.47 kg/m2 34.67 kg/m2 34.18 kg/m2    Physical Exam Constitutional:      Appearance: Normal appearance.  HENT:     Right Ear: Tympanic membrane, ear canal and external ear normal.     Left Ear: Tympanic membrane, ear canal and external ear normal.     Nose: Nose normal. No congestion or rhinorrhea.     Mouth/Throat:     Mouth: Mucous membranes are moist.     Pharynx: No oropharyngeal exudate or posterior oropharyngeal erythema.  Cardiovascular:     Rate and Rhythm: Normal rate and regular rhythm.     Heart sounds: Normal heart sounds.  Pulmonary:     Effort: Pulmonary effort is normal. No respiratory distress.     Breath sounds: Normal breath sounds. No wheezing, rhonchi or rales.  Abdominal:     General: Bowel sounds are normal.     Palpations: Abdomen is soft.     Tenderness: There is no abdominal tenderness.  Lymphadenopathy:     Cervical: No cervical adenopathy.  Neurological:     Mental Status: He is alert and oriented to person, place, and time.  Psychiatric:        Mood and Affect: Mood normal.        Behavior: Behavior normal.     Diabetic Foot Exam - Simple   No data filed      Lab Results  Component Value Date   WBC 6.0 10/28/2022   HGB 14.9 10/28/2022   HCT 46.1 10/28/2022   PLT 257 10/28/2022   GLUCOSE 99 10/28/2022   CHOL 178 10/28/2022   TRIG 91 10/28/2022   HDL 53 10/28/2022   LDLCALC 108 (H) 10/28/2022   ALT 18 10/28/2022   AST 18 10/28/2022   NA 140 10/28/2022   K 4.9 10/28/2022   CL 102 10/28/2022   CREATININE 1.06 10/28/2022   BUN 13 10/28/2022   CO2 22 10/28/2022   INR 0.96 04/29/2013      Assessment & Plan:    Encounter for general adult  medical examination with abnormal findings Assessment & Plan: Things to do to keep yourself  healthy  - Exercise at least 30-45 minutes a day, 3-4 days a week.  - Eat a low-fat diet with lots of fruits and vegetables, up to 7-9 servings per day.  - Seatbelts can save your life. Wear them always.  - Smoke detectors on every level of your home, check batteries every year.  - Eye Doctor - have an eye exam every 1-2 years  - Safe sex - if you may be exposed to STDs, use a condom.  - Alcohol -  If you drink, do it moderately, less than 2 drinks per day.  - Health Care Power of Attorney. Choose someone to speak for you if you are not able.  - Depression is common in our stressful world.If you're feeling down or losing interest in things you normally enjoy, please come in for a visit.  - Violence - If anyone is threatening or hurting you, please call immediately.   Screening cholesterol level -     Lipid panel  Screening for diabetes mellitus (DM) -     CBC with Differential/Platelet -     Comprehensive metabolic panel  Encounter for screening colonoscopy -     Ambulatory referral to Gastroenterology  Screening for HIV without presence of risk factors -     HIV Antibody (routine testing w rflx)  Encounter for hepatitis C screening test for low risk patient -     Hepatitis C antibody  Gastroesophageal reflux disease with esophagitis without hemorrhage Assessment & Plan: Well controlled.  Continue to work on eating a healthy diet and exercise.  Labs drawn today.   No major side effects reported, and no issues with compliance. The current medical regimen is effective;  continue present plan. Patient will inform me if he needs a prescription instead of using OTC.   Male erectile disorder Assessment & Plan: Well controlled.  Continue to work on eating a healthy diet and exercise.  Labs drawn today.   No major side effects reported, and no issues with compliance. The current medical  regimen is effective;  continue present plan with Revatio 20mg        No orders of the defined types were placed in this encounter.   Orders Placed This Encounter  Procedures   CBC with Differential/Platelet   Comprehensive metabolic panel   Lipid Panel   HIV Antibody (routine testing w rflx)   Hepatitis C Antibody   Ambulatory referral to Gastroenterology     Follow-up: Return in about 1 year (around 10/28/2023) for fasting, [place] Foley.   I,Jacqua L Marsh,acting as a scribe for [employer], PA.,have documented all relevant documentation on the behalf of Langley Gauss, PA,as directed by  Langley Gauss, PA while in the presence of Langley Gauss, [place].   An After Visit Summary was printed and given to the patient.  Langley Gauss, [place] [facility] [phone]

## 2022-10-28 ENCOUNTER — Ambulatory Visit: Payer: 59 | Admitting: Physician Assistant

## 2022-10-28 VITALS — BP 116/80 | HR 66 | Temp 97.1°F | Ht 64.0 in | Wt 195.0 lb

## 2022-10-28 DIAGNOSIS — Z0001 Encounter for general adult medical examination with abnormal findings: Secondary | ICD-10-CM

## 2022-10-28 DIAGNOSIS — Z1159 Encounter for screening for other viral diseases: Secondary | ICD-10-CM

## 2022-10-28 DIAGNOSIS — Z1211 Encounter for screening for malignant neoplasm of colon: Secondary | ICD-10-CM

## 2022-10-28 DIAGNOSIS — Z114 Encounter for screening for human immunodeficiency virus [HIV]: Secondary | ICD-10-CM

## 2022-10-28 DIAGNOSIS — N529 Male erectile dysfunction, unspecified: Secondary | ICD-10-CM

## 2022-10-28 DIAGNOSIS — K2100 Gastro-esophageal reflux disease with esophagitis, without bleeding: Secondary | ICD-10-CM | POA: Diagnosis not present

## 2022-10-28 DIAGNOSIS — Z13220 Encounter for screening for lipoid disorders: Secondary | ICD-10-CM | POA: Diagnosis not present

## 2022-10-28 DIAGNOSIS — Z131 Encounter for screening for diabetes mellitus: Secondary | ICD-10-CM | POA: Diagnosis not present

## 2022-10-28 NOTE — Patient Instructions (Signed)

## 2022-10-28 NOTE — Assessment & Plan Note (Signed)
Well controlled.  Continue to work on eating a healthy diet and exercise.  Labs drawn today.   No major side effects reported, and no issues with compliance. The current medical regimen is effective;  continue present plan with Revatio 20mg 

## 2022-10-28 NOTE — Assessment & Plan Note (Signed)
Well controlled.  Continue to work on eating a healthy diet and exercise.  Labs drawn today.   No major side effects reported, and no issues with compliance. The current medical regimen is effective;  continue present plan. Patient will inform me if he needs a prescription instead of using OTC.

## 2022-10-29 LAB — CBC WITH DIFFERENTIAL/PLATELET
Basophils Absolute: 0.1 10*3/uL (ref 0.0–0.2)
Basos: 2 %
EOS (ABSOLUTE): 0.2 10*3/uL (ref 0.0–0.4)
Eos: 4 %
Hematocrit: 46.1 % (ref 37.5–51.0)
Hemoglobin: 14.9 g/dL (ref 13.0–17.7)
Immature Grans (Abs): 0.1 10*3/uL (ref 0.0–0.1)
Immature Granulocytes: 1 %
Lymphocytes Absolute: 1.5 10*3/uL (ref 0.7–3.1)
Lymphs: 25 %
MCH: 28.4 pg (ref 26.6–33.0)
MCHC: 32.3 g/dL (ref 31.5–35.7)
MCV: 88 fL (ref 79–97)
Monocytes Absolute: 0.5 10*3/uL (ref 0.1–0.9)
Monocytes: 9 %
Neutrophils Absolute: 3.6 10*3/uL (ref 1.4–7.0)
Neutrophils: 59 %
Platelets: 257 10*3/uL (ref 150–450)
RBC: 5.25 x10E6/uL (ref 4.14–5.80)
RDW: 13 % (ref 11.6–15.4)
WBC: 6 10*3/uL (ref 3.4–10.8)

## 2022-10-29 LAB — HIV ANTIBODY (ROUTINE TESTING W REFLEX): HIV Screen 4th Generation wRfx: NONREACTIVE

## 2022-10-29 LAB — COMPREHENSIVE METABOLIC PANEL
ALT: 18 IU/L (ref 0–44)
AST: 18 IU/L (ref 0–40)
Albumin/Globulin Ratio: 2 (ref 1.2–2.2)
Albumin: 4.5 g/dL (ref 4.1–5.1)
Alkaline Phosphatase: 70 IU/L (ref 44–121)
BUN/Creatinine Ratio: 12 (ref 9–20)
BUN: 13 mg/dL (ref 6–24)
Bilirubin Total: 0.5 mg/dL (ref 0.0–1.2)
CO2: 22 mmol/L (ref 20–29)
Calcium: 9.6 mg/dL (ref 8.7–10.2)
Chloride: 102 mmol/L (ref 96–106)
Creatinine, Ser: 1.06 mg/dL (ref 0.76–1.27)
Globulin, Total: 2.2 g/dL (ref 1.5–4.5)
Glucose: 99 mg/dL (ref 70–99)
Potassium: 4.9 mmol/L (ref 3.5–5.2)
Sodium: 140 mmol/L (ref 134–144)
Total Protein: 6.7 g/dL (ref 6.0–8.5)
eGFR: 85 mL/min/{1.73_m2} (ref >59)

## 2022-10-29 LAB — LIPID PANEL
Chol/HDL Ratio: 3.4 ratio (ref 0.0–5.0)
Cholesterol, Total: 178 mg/dL (ref 100–199)
HDL: 53 mg/dL (ref >39)
LDL Chol Calc (NIH): 108 mg/dL — ABNORMAL HIGH (ref 0–99)
Triglycerides: 91 mg/dL (ref 0–149)
VLDL Cholesterol Cal: 17 mg/dL (ref 5–40)

## 2022-10-29 LAB — HEPATITIS C ANTIBODY: Hep C Virus Ab: NONREACTIVE

## 2022-11-10 ENCOUNTER — Encounter: Payer: Self-pay | Admitting: Physician Assistant

## 2022-11-10 DIAGNOSIS — Z13220 Encounter for screening for lipoid disorders: Secondary | ICD-10-CM | POA: Insufficient documentation

## 2022-11-10 DIAGNOSIS — Z1211 Encounter for screening for malignant neoplasm of colon: Secondary | ICD-10-CM | POA: Insufficient documentation

## 2022-11-10 DIAGNOSIS — Z1159 Encounter for screening for other viral diseases: Secondary | ICD-10-CM | POA: Insufficient documentation

## 2022-11-10 DIAGNOSIS — Z0001 Encounter for general adult medical examination with abnormal findings: Secondary | ICD-10-CM | POA: Insufficient documentation

## 2022-11-10 DIAGNOSIS — Z131 Encounter for screening for diabetes mellitus: Secondary | ICD-10-CM | POA: Insufficient documentation

## 2022-11-10 DIAGNOSIS — Z114 Encounter for screening for human immunodeficiency virus [HIV]: Secondary | ICD-10-CM | POA: Insufficient documentation

## 2022-11-10 NOTE — Assessment & Plan Note (Signed)

## 2022-12-15 ENCOUNTER — Telehealth: Payer: Self-pay | Admitting: *Deleted

## 2022-12-15 ENCOUNTER — Ambulatory Visit: Payer: Self-pay | Admitting: *Deleted

## 2022-12-15 NOTE — Patient Outreach (Signed)
  Care Coordination   Initial Visit Note   12/15/2022 Name: Craig Davenport MRN: [id number] DOB: 09-02-1970  Craig Davenport is a 52 y.o. year old male who sees [place], [place], [place] for primary care. I spoke with  Craig Davenport by phone today.  What matters to the patients health and wellness today?  CSW spoke with pt by phone and introduced them to the Care Coordination program and services it provides. Pt declines need or interest at this time. CSW advised pt to alert PCP if needs arise we can assist with in the future.     Goals Addressed   None     SDOH assessments and interventions completed:  No     Care Coordination Interventions:  No, not indicated   Follow up plan: No further intervention required.   Encounter Outcome:  Pt. Refused

## 2022-12-15 NOTE — Patient Outreach (Signed)
  Care Coordination   12/15/2022 Name: Craig Davenport MRN: [id number] DOB: [birth year]   Care Coordination Outreach Attempts:  An unsuccessful telephone outreach was attempted today to offer the patient information about available care coordination services.  Follow Up Plan:  Additional outreach attempts will be made to offer the patient care coordination information and services.   Encounter Outcome:  No Answer   Care Coordination Interventions:  No, not indicated    Reece Levy, MSW, LCSW Clinical Social Worker [facility] [phone]

## 2023-01-14 DIAGNOSIS — K219 Gastro-esophageal reflux disease without esophagitis: Secondary | ICD-10-CM | POA: Diagnosis not present

## 2023-02-19 DIAGNOSIS — K635 Polyp of colon: Secondary | ICD-10-CM | POA: Diagnosis not present

## 2023-02-19 DIAGNOSIS — Z1211 Encounter for screening for malignant neoplasm of colon: Secondary | ICD-10-CM | POA: Diagnosis not present

## 2023-02-19 DIAGNOSIS — D126 Benign neoplasm of colon, unspecified: Secondary | ICD-10-CM | POA: Diagnosis not present

## 2023-02-19 DIAGNOSIS — K5730 Diverticulosis of large intestine without perforation or abscess without bleeding: Secondary | ICD-10-CM | POA: Diagnosis not present

## 2023-02-19 DIAGNOSIS — D125 Benign neoplasm of sigmoid colon: Secondary | ICD-10-CM | POA: Diagnosis not present

## 2023-04-02 ENCOUNTER — Telehealth: Payer: Self-pay

## 2023-04-02 NOTE — Transitions of Care (Post Inpatient/ED Visit) (Signed)
04/02/2023  Name: Craig Davenport MRN: [id number] DOB: March 17, 1971  Today's TOC FU Call Status: Today's TOC FU Call Status:: Unsuccessful Call (1st Attempt) Unsuccessful Call (1st Attempt) Date: 04/02/23  Attempted to reach the patient regarding the most recent Inpatient/ED visit.  Follow Up Plan: No further outreach attempts will be made at this time. We have been unable to contact the patient.  [employer].

## 2023-06-12 DIAGNOSIS — J0100 Acute maxillary sinusitis, unspecified: Secondary | ICD-10-CM | POA: Diagnosis not present

## 2023-06-12 DIAGNOSIS — R0981 Nasal congestion: Secondary | ICD-10-CM | POA: Diagnosis not present

## 2023-09-02 ENCOUNTER — Ambulatory Visit (HOSPITAL_BASED_OUTPATIENT_CLINIC_OR_DEPARTMENT_OTHER): Admission: EM | Admit: 2023-09-02 | Discharge: 2023-09-02

## 2023-09-02 DIAGNOSIS — R109 Unspecified abdominal pain: Secondary | ICD-10-CM | POA: Diagnosis not present

## 2023-09-02 DIAGNOSIS — R10A0 Flank pain, unspecified side: Secondary | ICD-10-CM

## 2023-09-02 NOTE — ED Triage Notes (Signed)
 Triaged by provider for left flank pain.  Patient w/ c/f kidney stone and desiring imaging

## 2023-09-02 NOTE — Discharge Instructions (Addendum)
Go to the ER for CT scan

## 2023-09-02 NOTE — ED Provider Notes (Signed)
 [facility]    CSN: [id number] Arrival date & time: 09/02/23  1937      History   Chief Complaint No chief complaint on file.   HPI Craig Davenport is a 53 y.o. male.   Patient is a 53 year old male that presents today with left flank pain.  This has been present for the past few days.  Pain has been persistent.  He has never had pain like this before.  Went to urgent care prior to arrival and was told he had blood in his urine.  Denies any current urinary symptoms.  Denies any fever.  Denies any falls or injuries Patient is concerned for urethral stone.     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    Patient Active Problem List   Diagnosis Date Noted   Encounter for general adult medical examination with abnormal findings 11/10/2022   Screening cholesterol level 11/10/2022   Screening for diabetes mellitus (DM) 11/10/2022   Encounter for screening colonoscopy 11/10/2022   Screening for HIV without presence of risk factors 11/10/2022   Encounter for hepatitis C screening test for low risk patient 11/10/2022   Acute cough 07/06/2021   Acute streptococcal pharyngitis 07/06/2021   Mixed hyperlipidemia 03/26/2020   Male erectile disorder 03/26/2020   Gastro-esophageal reflux disease with esophagitis 03/26/2020   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   Malignant lymphoma, mixed cell type, 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    Class: History of    Past Surgical History:  Procedure Laterality Date   AXILLARY LYMPH NODE BIOPSY  2004   right   BACK SURGERY     lumbar lam   CHOLECYSTECTOMY     FINGER ARTHRODESIS Right 09/18/2014   Procedure: FUSION METACARPAL PHALANGEAL LEFT THUMB;  Surgeon:  Cindee Salt, MD;  Location: [facility];  Service: Orthopedics;  Laterality: Right;   INNER EAR SURGERY     Removal of mass behind eardrum   KNEE ARTHROSCOPY     right and left   OTHER SURGICAL HISTORY     Removal of mass behind eardrum        Home Medications    Prior to Admission medications   Medication Sig Start Date End Date Taking? Authorizing Provider  sildenafil (REVATIO) 20 MG tablet Take 1 tablet (20 mg total) by mouth 3 (three) times daily. 10/09/22   [facility] Mandes, MD    Family History Family History  Problem Relation Age of Onset   Osteoarthritis Father    Migraines Sister     Social History Social History   Tobacco Use   Smoking status: Never   Smokeless tobacco: Never  Substance Use Topics   Alcohol use: Not Currently    Comment: hx abuse-been sober 2013   Drug use: Never     Allergies   Oxycodone   Review of Systems Review of Systems  See HPI Physical Exam Triage Vital Signs ED Triage Vitals  Encounter Vitals Group     BP      Systolic BP Percentile      Diastolic BP Percentile      Pulse      Resp      Temp      Temp src  SpO2      Weight      Height      Head Circumference      Peak Flow      Pain Score      Pain Loc      Pain Education      Exclude from Growth Chart    No data found.  Updated Vital Signs There were no vitals taken for this visit.  Visual Acuity Right Eye Distance:   Left Eye Distance:   Bilateral Distance:    Right Eye Near:   Left Eye Near:    Bilateral Near:     Physical Exam Constitutional:      General: He is not in acute distress.    Appearance: He is not ill-appearing or toxic-appearing.  Pulmonary:     Effort: Pulmonary effort is normal.  Abdominal:     Tenderness: There is left CVA tenderness.  Skin:    General: Skin is warm and dry.  Neurological:     Mental Status: He is alert.     UC Treatments / Results  Labs (all labs ordered are listed, but only abnormal  results are displayed) Labs Reviewed - No data to display  EKG   Radiology No results found.  Procedures Procedures (including critical care time)  Medications Ordered in UC Medications - No data to display  Initial Impression / Assessment and Plan / UC Course  I have reviewed the triage vital signs and the nursing notes.  Pertinent labs & imaging results that were available during my care of the patient were reviewed by me and considered in my medical decision making (see chart for details).     Left flank pain-patient with left flank pain and concern for kidney stone.   VSS and in no distress  Recommended that we could do treatment at home for now and see if the kidney stone passes.  Recommended do Flomax and pain medication, push fluids.  Patient is curious to now how large the kidney stone is and would like to have a CT scan. Told patient that CT scan is not available here at this time.  That he will have to go to the ER.  Patient decided to go to the ER for further evaluation Final Clinical Impressions(s) / UC Diagnoses   Final diagnoses:  Flank pain     Discharge Instructions      Go to the ER for CT scan      ED Prescriptions   None    PDMP not reviewed this encounter.   Janace Aris, FNP 09/02/23 2000

## 2023-09-02 NOTE — ED Notes (Signed)
 Patient is being discharged from the Urgent Care and sent to the Emergency Department via private vehicle . Per NP Jaci Lazier, patient is in need of higher level of care due to wanting imaging for possible kidney stone. Patient is aware and verbalizes understanding of plan of care.  Vitals:   09/02/23 2003  BP: (!) 152/81  Pulse: (!) 104  Resp: 18  Temp: 98.7 F (37.1 C)  SpO2: 99%

## 2023-10-29 ENCOUNTER — Ambulatory Visit: Payer: 59 | Admitting: Physician Assistant

## 2023-11-18 ENCOUNTER — Other Ambulatory Visit: Payer: Self-pay | Admitting: Family Medicine

## 2023-11-18 DIAGNOSIS — N529 Male erectile dysfunction, unspecified: Secondary | ICD-10-CM

## 2023-12-29 ENCOUNTER — Encounter: Payer: Self-pay | Admitting: Family Medicine

## 2024-01-03 ENCOUNTER — Encounter: Payer: Self-pay | Admitting: Family Medicine

## 2024-01-03 ENCOUNTER — Ambulatory Visit: Payer: Self-pay | Admitting: Family Medicine

## 2024-01-03 VITALS — BP 104/68 | HR 72 | Temp 98.2°F | Resp 18 | Ht 64.0 in | Wt 191.4 lb

## 2024-01-03 DIAGNOSIS — E785 Hyperlipidemia, unspecified: Secondary | ICD-10-CM | POA: Insufficient documentation

## 2024-01-03 DIAGNOSIS — E66811 Obesity, class 1: Secondary | ICD-10-CM

## 2024-01-03 DIAGNOSIS — Z0000 Encounter for general adult medical examination without abnormal findings: Secondary | ICD-10-CM | POA: Insufficient documentation

## 2024-01-03 DIAGNOSIS — N529 Male erectile dysfunction, unspecified: Secondary | ICD-10-CM | POA: Diagnosis not present

## 2024-01-03 DIAGNOSIS — Z6832 Body mass index (BMI) 32.0-32.9, adult: Secondary | ICD-10-CM

## 2024-01-03 DIAGNOSIS — E6609 Other obesity due to excess calories: Secondary | ICD-10-CM | POA: Insufficient documentation

## 2024-01-03 DIAGNOSIS — Z125 Encounter for screening for malignant neoplasm of prostate: Secondary | ICD-10-CM | POA: Insufficient documentation

## 2024-01-03 MED ORDER — SILDENAFIL CITRATE 20 MG PO TABS: 20.0000 mg | ORAL_TABLET | Freq: Three times a day (TID) | ORAL | 6 refills | Status: DC

## 2024-01-03 NOTE — Assessment & Plan Note (Signed)
 General Health Maintenance 53 year old male, slightly overweight, aiming for weight loss. Healthy lifestyle with regular exercise and dietary focus. Overdue for dental check-up. Consumes sodas and ice cream before bed. Does not use sunblock. - Order cholesterol and routine labs. - Order PSA test. - Advise scheduling a dental check-up. - Encourage weight loss through diet and exercise. - Advise reducing soda intake and avoiding ice cream before bed. - Encourage use of sunblock.  Things to do to keep yourself healthy  - Exercise at least 30-45 minutes a day, 3-4 days a week.  - Eat a low-fat diet with lots of fruits and vegetables, up to 7-9 servings per day.  - Seatbelts can save your life. Wear them always.  - Smoke detectors on every level of your home, check batteries every year.  - Eye Doctor - have an eye exam every 1-2 years  - Safe sex - if you may be exposed to STDs, use a condom.  - Alcohol  -  If you drink, do it moderately, less than 2 drinks per day.  - Health Care Power of Attorney. Choose someone to speak for you if you are not able.  - Depression is common in our stressful world.If you're feeling down or losing interest in things you normally enjoy, please come in for a visit.  - Violence - If anyone is threatening or hurting you, please call immediately.

## 2024-01-03 NOTE — Assessment & Plan Note (Signed)
 Well controlled without medication Lab Results  Component Value Date   LDLCALC 108 (H) 10/28/2022   Labs drawn today, Await labs/testing for assessment and recommendations - Reduce soda and ice cream discussed - Continue to work on diet and exercise.

## 2024-01-03 NOTE — Progress Notes (Deleted)
 Subjective:  Patient ID: Craig Davenport, male    DOB: 1971-05-28  Age: 53 y.o. MRN: [id number]  Chief Complaint  Patient presents with   Medical Management of Chronic Issues    HPI:     01/03/2024    8:20 AM 10/28/2022    7:36 AM 03/26/2020    9:28 AM  Depression screen PHQ 2/9  Decreased Interest 0 0 0  Down, Depressed, Hopeless 0 0 0  PHQ - 2 Score 0 0 0  Altered sleeping 0    Tired, decreased energy 0    Change in appetite 0    Feeling bad or failure about yourself  0    Trouble concentrating 0    Moving slowly or fidgety/restless 0    Suicidal thoughts 0    PHQ-9 Score 0    Difficult doing work/chores Not difficult at all          01/03/2024    8:20 AM  Fall Risk   Falls in the past year? 0  Number falls in past yr: 0  Injury with Fall? 0  Risk for fall due to : No Fall Risks  Follow up Falls evaluation completed    Patient Care Team: Teressa Harrie HERO, FNP as PCP - General (Family Medicine)   Review of Systems  Constitutional:  Negative for appetite change, fatigue and fever.  HENT:  Negative for congestion, ear pain, sinus pressure and sore throat.   Eyes: Negative.   Respiratory:  Negative for cough, chest tightness, shortness of breath and wheezing.   Cardiovascular:  Negative for chest pain and palpitations.  Gastrointestinal:  Negative for abdominal pain, constipation, diarrhea, nausea and vomiting.  Endocrine: Negative.   Genitourinary:  Negative for dysuria and hematuria.  Musculoskeletal:  Negative for arthralgias, back pain, joint swelling and myalgias.  Skin:  Negative for rash.  Allergic/Immunologic: Negative.   Neurological:  Negative for dizziness, weakness and headaches.  Hematological: Negative.   Psychiatric/Behavioral:  Negative for dysphoric mood. The patient is not nervous/anxious.     Current Outpatient Medications on File Prior to Visit  Medication Sig Dispense Refill   sildenafil  (REVATIO ) 20 MG tablet Take 1 tablet (20 mg total)  by mouth 3 (three) times daily. 100 tablet 6   No current facility-administered medications on file prior to visit.   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   Past Surgical History:  Procedure Laterality Date   AXILLARY LYMPH NODE BIOPSY  2004   right   BACK SURGERY     lumbar lam   CHOLECYSTECTOMY     FINGER ARTHRODESIS Right 09/18/2014   Procedure: FUSION METACARPAL PHALANGEAL LEFT THUMB;  Surgeon: Arley Curia, MD;  Location: [facility];  Service: Orthopedics;  Laterality: Right;   INNER EAR SURGERY     Removal of mass behind eardrum   KNEE ARTHROSCOPY     right and left   OTHER SURGICAL HISTORY     Removal of mass behind eardrum     Family History  Problem Relation Age of Onset   Osteoarthritis Father    Migraines Sister    Social History   Socioeconomic History   Marital status: Married    Spouse  name: Not on file   Number of children: 1   Years of education: Not on file   Highest education level: Not on file  Occupational History   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  Tobacco Use   Smoking status: Never   Smokeless tobacco: Never  Substance and Sexual Activity   Alcohol  use: Not Currently    Comment: hx abuse-been sober 2013   Drug use: Never   Sexual activity: Yes    Partners: Female  Other Topics Concern   Not on file  Social History Narrative   Not on file   Social Drivers of Health   Financial Resource Strain: Not on file  Food Insecurity: Not on file  Transportation Needs: Not on file  Physical Activity: Not on file  Stress: Not on file  Social Connections: Not on file    Objective:  BP 104/68   Pulse 72   Temp 98.2 F (36.8 C) (Temporal)   Resp 18    Ht 5' 4 (1.626 m)   Wt 191 lb 6.4 oz (86.8 kg)   SpO2 97%   BMI 32.85 kg/m      01/03/2024    8:17 AM 09/02/2023    8:03 PM 10/28/2022    7:34 AM  BP/Weight  Systolic BP 104 152 116  Diastolic BP 68 81 80  Wt. (Lbs) 191.4  195  BMI 32.85 kg/m2  33.47 kg/m2    Physical Exam  {Perform Simple Foot Exam  Perform Detailed exam:1} {Insert foot Exam (Optional):30965}   Lab Results  Component Value Date   WBC 6.0 10/28/2022   HGB 14.9 10/28/2022   HCT 46.1 10/28/2022   PLT 257 10/28/2022   GLUCOSE 99 10/28/2022   CHOL 178 10/28/2022   TRIG 91 10/28/2022   HDL 53 10/28/2022   LDLCALC 108 (H) 10/28/2022   ALT 18 10/28/2022   AST 18 10/28/2022   NA 140 10/28/2022   K 4.9 10/28/2022   CL 102 10/28/2022   CREATININE 1.06 10/28/2022   BUN 13 10/28/2022   CO2 22 10/28/2022   INR 0.96 04/29/2013      Assessment & Plan:  Male erectile disorder     No orders of the defined types were placed in this encounter.   No orders of the defined types were placed in this encounter.    Follow-up: No follow-ups on file.   I,Kella Splinter,acting as a Neurosurgeon for Harrie CHRISTELLA Cedar, FNP.,have documented all relevant documentation on the behalf of Harrie CHRISTELLA Cedar, FNP,as directed by  Harrie CHRISTELLA Cedar, FNP while in the presence of Harrie CHRISTELLA Cedar, FNP.   An After Visit Summary was printed and given to the patient.  Harrie CHRISTELLA Cedar, FNP [facility] [phone]

## 2024-01-03 NOTE — Assessment & Plan Note (Signed)
 BMI 32.95, not at goal - Encourage weight loss through diet and exercise. - Advise reducing soda intake and avoiding ice cream before bed. Labs drawn, Await labs/testing for assessment and recommendations

## 2024-01-03 NOTE — Progress Notes (Signed)
 Subjective:  Patient ID: Craig Davenport, male    DOB: 12/08/1970  Age: 53 y.o. MRN: [id number]  Chief Complaint  Patient presents with   Medical Management of Chronic Issues   Discussed the use of AI scribe software for clinical note transcription with the patient, who gave verbal consent to proceed.  History of Present Illness   Craig Davenport is a 52 year old male who presents for a routine follow-up and medication refill.  He has no current symptoms such as chest pain, shortness of breath, vomiting, diarrhea, constipation, or sleep disturbances. No issues with hearing, ear fullness, swallowing, taste, or vision. No history of thyroid problems.  He has started running this year and engages in daily physical activity, primarily walking. He aims to lose 15 to 20 pounds and is working on dietary changes, including reducing soda and ice cream intake. He drinks a lot of water during the day.  He wears a seatbelt and practices good gun safety but does not wear sunblock. He gets his eyes checked annually and reports that it has been a while since his last dental visit.  He has had a colonoscopy in the past.  Well Adult Physical: Patient here for a comprehensive physical exam.The patient reports no problems Do you take any herbs or supplements that were not prescribed by a doctor? no Are you taking calcium supplements? no Are you taking aspirin  daily? no  Encounter for general adult medical examination without abnormal findings  Physical (At Risk items are starred): Patient's last physical exam was 1 year ago .  Patient wears a seat belt, has smoke detectors,  has carbon monoxide detectors, practices appropriate gun safety, and does not wears sunscreen with extended sun exposure. Dental Care: over due for dentist appointment. Ophthalmology/Optometry: Annual visit.  Hearing loss: none Vision impairments: none Last PSA: labs drawn today for the first PSA        01/03/2024    8:20 AM  10/28/2022    7:36 AM 03/26/2020    9:28 AM  Depression screen PHQ 2/9  Decreased Interest 0 0 0  Down, Depressed, Hopeless 0 0 0  PHQ - 2 Score 0 0 0  Altered sleeping 0    Tired, decreased energy 0    Change in appetite 0    Feeling bad or failure about yourself  0    Trouble concentrating 0    Moving slowly or fidgety/restless 0    Suicidal thoughts 0    PHQ-9 Score 0    Difficult doing work/chores Not difficult at all           03/26/2020    9:28 AM 04/02/2021    8:54 AM 10/28/2022    7:36 AM 01/03/2024    8:20 AM  Fall Risk  Falls in the past year? 0  0 0  Was there an injury with Fall? 0  0 0  Fall Risk Category Calculator 0  0 0  Fall Risk Category (Retired) Low      (RETIRED) Patient Fall Risk Level Low fall risk  Low fall risk     Patient at Risk for Falls Due to   No Fall Risks No Fall Risks  Fall risk Follow up Falls evaluation completed   Falls evaluation completed Falls evaluation completed     Data saved with a previous flowsheet row definition              Past Medical History:  Diagnosis Date   Alcohol   abuse    Gastro-esophageal reflux disease with esophagitis 03/26/2020   Lymphoma (HCC) 2004   had radiation and chemo   Male erectile disorder 03/26/2020   Mixed hyperlipidemia 03/26/2020   Unspecified b-cell lymphoma, 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   Past Surgical History:  Procedure Laterality Date   AXILLARY LYMPH NODE BIOPSY  2004   right   BACK SURGERY     lumbar lam   CHOLECYSTECTOMY     FINGER ARTHRODESIS Right 09/18/2014   Procedure: FUSION METACARPAL PHALANGEAL LEFT THUMB;  Surgeon: Arley Curia, MD;  Location: [facility];  Service: Orthopedics;  Laterality: Right;   INNER EAR SURGERY     Removal of mass behind eardrum   KNEE ARTHROSCOPY     right and left   OTHER SURGICAL HISTORY     Removal of mass behind eardrum     Family History  Problem Relation Age of Onset   Osteoarthritis Father     Migraines Sister    Social History   Socioeconomic History   Marital status: Married    Spouse name: Not on file   Number of children: 1   Years of education: Not on file   Highest education level: Not on file  Occupational History   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  Tobacco Use   Smoking status: Never   Smokeless tobacco: Never  Substance and Sexual Activity   Alcohol  use: Not Currently    Comment: hx abuse-been sober 2013   Drug use: Never   Sexual activity: Yes    Partners: Female  Other Topics Concern   Not on file  Social History Narrative   Not on file   Social Drivers of Health   Financial Resource Strain: Not on file  Food Insecurity: Not on file  Transportation Needs: Not on file  Physical Activity: Not on file  Stress: Not on file  Social Connections: Not on file   Review of Systems  Constitutional:  Negative for appetite change, fatigue, fever and unexpected weight change.  HENT:  Negative for congestion, ear pain, sinus pressure, sinus pain and sore throat.   Eyes: Negative.   Respiratory:  Negative for cough, chest tightness, shortness of breath and wheezing.   Cardiovascular:  Negative for chest pain and palpitations.  Gastrointestinal:  Negative for abdominal pain, constipation, diarrhea, nausea and vomiting.  Endocrine: Negative.   Genitourinary:  Negative for dysuria and hematuria.  Musculoskeletal:  Negative for arthralgias, back pain, joint swelling and myalgias.  Skin:  Negative for rash.  Allergic/Immunologic: Negative.   Neurological:  Negative for dizziness, weakness and headaches.  Hematological: Negative.   Psychiatric/Behavioral:  Negative for dysphoric mood. The patient is not nervous/anxious.      Objective:  BP 104/68   Pulse 72   Temp 98.2 F (36.8 C) (Temporal)   Resp 18   Ht 5' 4 (1.626 m)   Wt 191 lb 6.4 oz (86.8 kg)   SpO2 97%   BMI 32.85 kg/m      01/03/2024    8:17 AM 09/02/2023    8:03 PM 10/28/2022    7:34 AM   BP/Weight  Systolic BP 104 152 116  Diastolic BP 68 81 80  Wt. (Lbs) 191.4  195  BMI 32.85 kg/m2  33.47 kg/m2    Physical Exam Vitals reviewed.  Constitutional:      General: He is not in acute distress.    Appearance: Normal appearance. He is well-groomed. He is obese.  He is not ill-appearing.  HENT:     Head: Normocephalic.     Right Ear: Tympanic membrane, ear canal and external ear normal.     Left Ear: Tympanic membrane, ear canal and external ear normal.     Nose: Nose normal.     Mouth/Throat:     Mouth: Mucous membranes are moist.     Pharynx: Oropharynx is clear.  Eyes:     Extraocular Movements: Extraocular movements intact.     Conjunctiva/sclera: Conjunctivae normal.     Pupils: Pupils are equal, round, and reactive to light.  Cardiovascular:     Rate and Rhythm: Normal rate and regular rhythm.     Pulses: Normal pulses.     Heart sounds: Normal heart sounds. No murmur heard. Pulmonary:     Effort: Pulmonary effort is normal.     Breath sounds: Normal breath sounds. No wheezing.  Abdominal:     General: Bowel sounds are normal.     Palpations: Abdomen is soft. There is no mass.     Tenderness: There is no abdominal tenderness.     Hernia: No hernia is present.  Musculoskeletal:        General: No swelling. Normal range of motion.     Cervical back: Normal range of motion and neck supple. No rigidity or tenderness.  Lymphadenopathy:     Cervical: No cervical adenopathy.  Skin:    General: Skin is warm and dry.  Neurological:     Mental Status: He is alert and oriented to person, place, and time. Mental status is at baseline.     Cranial Nerves: Cranial nerves 2-12 are intact.     Sensory: Sensation is intact.     Motor: Motor function is intact.     Coordination: Coordination is intact.     Gait: Gait is intact.     Deep Tendon Reflexes: Reflexes are normal and symmetric.  Psychiatric:        Attention and Perception: Attention normal.        Mood and  Affect: Mood normal.        Speech: Speech normal.        Behavior: Behavior normal.        Thought Content: Thought content normal.        Cognition and Memory: Cognition normal.        Judgment: Judgment normal.     Lab Results  Component Value Date   WBC 6.0 10/28/2022   HGB 14.9 10/28/2022   HCT 46.1 10/28/2022   PLT 257 10/28/2022   GLUCOSE 99 10/28/2022   CHOL 178 10/28/2022   TRIG 91 10/28/2022   HDL 53 10/28/2022   LDLCALC 108 (H) 10/28/2022   ALT 18 10/28/2022   AST 18 10/28/2022   NA 140 10/28/2022   K 4.9 10/28/2022   CL 102 10/28/2022   CREATININE 1.06 10/28/2022   BUN 13 10/28/2022   CO2 22 10/28/2022   INR 0.96 04/29/2013    Assessment & Plan:  Annual physical exam Assessment & Plan: General Health Maintenance 53 year old male, slightly overweight, aiming for weight loss. Healthy lifestyle with regular exercise and dietary focus. Overdue for dental check-up. Consumes sodas and ice cream before bed. Does not use sunblock. - Order cholesterol and routine labs. - Order PSA test. - Advise scheduling a dental check-up. - Encourage weight loss through diet and exercise. - Advise reducing soda intake and avoiding ice cream before bed. - Encourage use of sunblock.  Things  to do to keep yourself healthy  - Exercise at least 30-45 minutes a day, 3-4 days a week.  - Eat a low-fat diet with lots of fruits and vegetables, up to 7-9 servings per day.  - Seatbelts can save your life. Wear them always.  - Smoke detectors on every level of your home, check batteries every year.  - Eye Doctor - have an eye exam every 1-2 years  - Safe sex - if you may be exposed to STDs, use a condom.  - Alcohol  -  If you drink, do it moderately, less than 2 drinks per day.  - Health Care Power of Attorney. Choose someone to speak for you if you are not able.  - Depression is common in our stressful world.If you're feeling down or losing interest in things you normally enjoy, please  come in for a visit.  - Violence - If anyone is threatening or hurting you, please call immediately.    Dyslipidemia Assessment & Plan: Well controlled without medication Lab Results  Component Value Date   LDLCALC 108 (H) 10/28/2022   Labs drawn today, Await labs/testing for assessment and recommendations - Reduce soda and ice cream discussed - Continue to work on diet and exercise.   Orders: -     Lipid panel -     CMP14+EGFR -     CBC with Differential/Platelet -     TSH  Male erectile disorder Assessment & Plan: Well controlled.  Continue to work on eating a healthy diet and exercise.  Labs drawn today.   No major side effects reported, and no issues with compliance. The current medical regimen is effective;  continue present plan with Revatio  20mg   Orders: -     Sildenafil  Citrate; Take 1 tablet (20 mg total) by mouth 3 (three) times daily.  Dispense: 100 tablet; Refill: 6  Class 1 obesity due to excess calories with serious comorbidity and body mass index (BMI) of 32.0 to 32.9 in adult Assessment & Plan: BMI 32.95, not at goal - Encourage weight loss through diet and exercise. - Advise reducing soda intake and avoiding ice cream before bed. Labs drawn, Await labs/testing for assessment and recommendations   Orders: -     Lipid panel  Screening for prostate cancer -     PSA       Body mass index is 32.85 kg/m.   These are the goals we discussed:  Goals      Develop a Healthy Weight Readiness Plan-Pediatric     Timeframe:  Long-Range Goal Priority:  High Start Date:   01/03/24                           Expected End Date:    01/02/25                   Follow Up Date 01/02/2025    - begin a weight loss diary - get rid of junk food - identify what might get in the way of success - pick a start date - stock up on healthy food choices - track current amount and type of exercise    Why is this important?   Losing weight requires time to prepare.   Identify your child's/your eating habits and the emotions attached to eating.  Think about ways to be successful.  When your child/you are not ready, motivation can be lost and then your child/you may give up on  the plan.     Notes:  Aim to do some physical activity for 150 minutes per week. This is typically divided into 5 days per week, 30 minutes per day. The activity should be enough to get your heart rate up. Anything is better than nothing if you have time constraints.           This is a list of the screening recommended for you and due dates:  Health Maintenance  Topic Date Due   COVID-19 Vaccine (1) 01/03/2024*   DTaP/Tdap/Td vaccine (2 - Td or Tdap) 01/02/2025*   Pneumococcal Vaccination (1 of 2 - PCV) 01/02/2025*   Hepatitis B Vaccine (1 of 3 - 19+ 3-dose series) 01/02/2025*   Zoster (Shingles) Vaccine (1 of 2) 01/22/2028*   Flu Shot  01/14/2024   Colon Cancer Screening  02/18/2033   Hepatitis C Screening  Completed   HIV Screening  Completed   HPV Vaccine  Aged Out   Meningitis B Vaccine  Aged Out  *Topic was postponed. The date shown is not the original due date.     Meds ordered this encounter  Medications   sildenafil  (REVATIO ) 20 MG tablet    Sig: Take 1 tablet (20 mg total) by mouth 3 (three) times daily.    Dispense:  100 tablet    Refill:  6     Follow-up: Return in about 6 months (around 07/05/2024) for chronic, Annual Physical.  An After Visit Summary was printed and given to the patient.  Harrie Cedar, FNP [facility] [phone]

## 2024-01-03 NOTE — Assessment & Plan Note (Signed)
Well controlled.  Continue to work on eating a healthy diet and exercise.  Labs drawn today.   No major side effects reported, and no issues with compliance. The current medical regimen is effective;  continue present plan with Revatio 20mg 

## 2024-01-05 NOTE — Addendum Note (Signed)
 Addended by: RANEE KATO I on: 01/05/2024 09:23 AM   Modules accepted: Orders

## 2024-01-07 ENCOUNTER — Other Ambulatory Visit

## 2024-07-07 ENCOUNTER — Encounter: Payer: Self-pay | Admitting: Family Medicine

## 2024-07-07 ENCOUNTER — Ambulatory Visit: Payer: Self-pay | Admitting: Family Medicine

## 2024-07-07 VITALS — BP 98/62 | HR 71 | Temp 98.0°F | Resp 18 | Ht 64.0 in | Wt 203.2 lb

## 2024-07-07 DIAGNOSIS — Z6832 Body mass index (BMI) 32.0-32.9, adult: Secondary | ICD-10-CM

## 2024-07-07 DIAGNOSIS — N529 Male erectile dysfunction, unspecified: Secondary | ICD-10-CM

## 2024-07-07 DIAGNOSIS — E6609 Other obesity due to excess calories: Secondary | ICD-10-CM

## 2024-07-07 DIAGNOSIS — Z0000 Encounter for general adult medical examination without abnormal findings: Secondary | ICD-10-CM | POA: Diagnosis not present

## 2024-07-07 DIAGNOSIS — Z125 Encounter for screening for malignant neoplasm of prostate: Secondary | ICD-10-CM | POA: Diagnosis not present

## 2024-07-07 DIAGNOSIS — E66811 Obesity, class 1: Secondary | ICD-10-CM

## 2024-07-07 DIAGNOSIS — E785 Hyperlipidemia, unspecified: Secondary | ICD-10-CM | POA: Diagnosis not present

## 2024-07-07 MED ORDER — SILDENAFIL CITRATE 20 MG PO TABS: 20.0000 mg | ORAL_TABLET | Freq: Three times a day (TID) | ORAL | 6 refills | Status: AC

## 2024-07-07 NOTE — Assessment & Plan Note (Signed)
 Orders:    PSA

## 2024-07-07 NOTE — Assessment & Plan Note (Signed)
 Annual physical examination Blood pressure well-controlled. No acute concerns. - Performed head-to-toe physical examination. - Ordered lab work: PSA, thyroid, cholesterol, liver, kidney, CBC.  Things to do to keep yourself healthy  - Exercise at least 30-45 minutes a day, 3-4 days a week.  - Eat a low-fat diet with lots of fruits and vegetables, up to 7-9 servings per day.  - Seatbelts can save your life. Wear them always.  - Smoke detectors on every level of your home, check batteries every year.  - Eye Doctor - have an eye exam every 1-2 years  - Safe sex - if you may be exposed to STDs, use a condom.  - Alcohol  -  If you drink, do it moderately, less than 2 drinks per day.  - Health Care Power of Attorney. Choose someone to speak for you if you are not able.  - Depression is common in our stressful world.If you're feeling down or losing interest in things you normally enjoy, please come in for a visit.  - Violence - If anyone is threatening or hurting you, please call immediately.  Orders:   TSH

## 2024-07-07 NOTE — Assessment & Plan Note (Signed)
 Dyslipidemia No current cholesterol medication. Discussed potential cardiovascular benefits of intermittent use, especially with family history of cardiovascular events. Last lipids Lab Results  Component Value Date   CHOL 178 10/28/2022   HDL 53 10/28/2022   LDLCALC 108 (H) 10/28/2022   TRIG 91 10/28/2022   CHOLHDL 3.4 10/28/2022   The 10-year ASCVD risk score (Arnett DK, et al., 2019) is: 1.9%   Values used to calculate the score:     Age: 54 years     Clinically relevant sex: Male     Is Non-Hispanic African American: No     Diabetic: No     Tobacco smoker: No     Systolic Blood Pressure: 98 mmHg     Is BP treated: No     HDL Cholesterol: 53 mg/dL     Total Cholesterol: 178 mg/dL  - Evaluate cardiovascular risk score. - Consider intermittent cholesterol medication use. Orders:   Lipid Panel   Comprehensive metabolic panel with GFR   CBC with Differential

## 2024-07-07 NOTE — Assessment & Plan Note (Signed)
 Male erectile dysfunction Managed with sildenafil . No new concerns. - Refilled sildenafil  prescription.  Orders:   sildenafil  (REVATIO ) 20 MG tablet; Take 1 tablet (20 mg total) by mouth 3 (three) times daily.

## 2024-07-07 NOTE — Assessment & Plan Note (Signed)
 BMI 34.88, not at goal Class 1 obesity Reports eating well but lacks regular exercise. - Encouraged healthy eating and regular exercise. Orders:   Vitamin D, 25-hydroxy

## 2024-07-07 NOTE — Progress Notes (Signed)
 "  Subjective:  Patient ID: Craig Davenport, male    DOB: 08-Apr-1971  Age: 54 y.o. MRN: [id number]  Chief Complaint  Patient presents with   Medical Management of Chronic Issues   Annual Exam    Well Adult Physical: Patient here for a comprehensive physical exam.The patient reports no problems Do you take any herbs or supplements that were not prescribed by a doctor? no Are you taking calcium supplements? no Are you taking aspirin  daily? no  Encounter for general adult medical examination without abnormal findings  Physical (At Risk items are starred): Patient's last physical exam was 1 year ago .  Patient wears a seat belt, has smoke detectors, has carbon monoxide detectors, practices appropriate gun safety, and wears sunscreen with extended sun exposure. Dental Care: biannual cleanings, brushes and flosses daily. Ophthalmology/Optometry: Annual visit.  Hearing loss: none Vision impairments: none Last PSA: never done  Discussed the use of AI scribe software for clinical note transcription with the patient, who gave verbal consent to proceed.  History of Present Illness   Craig Davenport is a 54 year old male who presents for an annual physical exam and medication refills.  Obesity - Goal to develop healthy weight readiness - Admits to overeating - Considers work activity as sufficient exercise  Dyslipidemia - History of high cholesterol - Denies family history of heart disease - Does not take cholesterol medication - Denies chest pain or shortness of breath  Male erectile dysfunction - Currently taking sildenafil  - He is requesting a refill  Laboratory testing - Unable to complete lab work during last visit in July due to unsuccessful blood draw attempts          07/07/2024    7:50 AM 01/03/2024    8:20 AM 10/28/2022    7:36 AM 03/26/2020    9:28 AM  Depression screen PHQ 2/9  Decreased Interest 0 0 0 0  Down, Depressed, Hopeless 0 0 0 0  PHQ - 2 Score 0 0 0 0   Altered sleeping 0 0    Tired, decreased energy 0 0    Change in appetite 0 0    Feeling bad or failure about yourself  0 0    Trouble concentrating 0 0    Moving slowly or fidgety/restless 0 0    Suicidal thoughts 0 0    PHQ-9 Score 0 0     Difficult doing work/chores Not difficult at all Not difficult at all       Data saved with a previous flowsheet row definition         03/26/2020    9:28 AM 04/02/2021    8:54 AM 10/28/2022    7:36 AM 01/03/2024    8:20 AM 07/07/2024    7:50 AM  Fall Risk  Falls in the past year? 0  0 0 0  Was there an injury with Fall? 0   0  0  0  Fall Risk Category Calculator 0  0 0 0  Fall Risk Category (Retired) Low       (RETIRED) Patient Fall Risk Level Low fall risk  Low fall risk      Patient at Risk for Falls Due to   No Fall Risks No Fall Risks No Fall Risks  Fall risk Follow up Falls evaluation completed   Falls evaluation completed Falls evaluation completed Falls evaluation completed     Data saved with a previous flowsheet row definition  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   Past Surgical History:  Procedure Laterality Date   AXILLARY LYMPH NODE BIOPSY  2004   right   BACK SURGERY     lumbar lam   CHOLECYSTECTOMY     FINGER ARTHRODESIS Right 09/18/2014   Procedure: FUSION METACARPAL PHALANGEAL LEFT THUMB;  Surgeon: Arley Curia, MD;  Location: [facility];  Service: Orthopedics;  Laterality: Right;   INNER EAR SURGERY     Removal of mass behind eardrum   KNEE ARTHROSCOPY     right and left   OTHER SURGICAL HISTORY     Removal of mass behind eardrum     Family History  Problem Relation Age of Onset   Osteoarthritis  Father    Migraines Sister    Social History   Socioeconomic History   Marital status: Married    Spouse name: Not on file   Number of children: 1   Years of education: Not on file   Highest education level: Not on file  Occupational History   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  Tobacco Use   Smoking status: Never   Smokeless tobacco: Never  Substance and Sexual Activity   Alcohol  use: Not Currently    Comment: hx abuse-been sober 2013   Drug use: Never   Sexual activity: Yes    Partners: Female  Other Topics Concern   Not on file  Social History Narrative   Not on file   Social Drivers of Health   Tobacco Use: Low Risk (07/07/2024)   Patient History    Smoking Tobacco Use: Never    Smokeless Tobacco Use: Never    Passive Exposure: Not on file  Financial Resource Strain: Not on file  Food Insecurity: Not on file  Transportation Needs: Not on file  Physical Activity: Not on file  Stress: Not on file  Social Connections: Not on file  Depression (PHQ2-9): Low Risk (07/07/2024)   Depression (PHQ2-9)    PHQ-2 Score: 0  Alcohol  Screen: Not on file  Housing: Not on file  Utilities: Not on file  Health Literacy: Not on file   Review of Systems  Constitutional:  Negative for appetite change, fatigue and fever.  HENT:  Negative for congestion, ear pain, sinus pressure and sore throat.   Eyes: Negative.   Respiratory:  Negative for cough, chest tightness, shortness of breath and wheezing.   Cardiovascular:  Negative for chest pain and palpitations.  Gastrointestinal:  Negative for abdominal pain, constipation, diarrhea, nausea and vomiting.  Endocrine: Negative.   Genitourinary:  Negative for dysuria, frequency, hematuria and urgency.  Musculoskeletal:  Negative for arthralgias, back pain, joint swelling and myalgias.  Skin:  Negative for rash.  Allergic/Immunologic: Negative.   Neurological:  Negative for dizziness, weakness, light-headedness and headaches.   Psychiatric/Behavioral:  Negative for dysphoric mood. The patient is not nervous/anxious.      Objective:  BP 98/62   Pulse 71   Temp 98 F (36.7 C) (Temporal)   Resp 18   Ht 5' 4 (1.626 m)   Wt 203 lb 3.2 oz (92.2 kg)   SpO2 98%   BMI 34.88 kg/m      07/07/2024    7:47  AM 01/03/2024    8:17 AM 09/02/2023    8:03 PM  BP/Weight  Systolic BP 98 104 152  Diastolic BP 62 68 81  Wt. (Lbs) 203.2 191.4   BMI 34.88 kg/m2 32.85 kg/m2     Physical Exam Vitals reviewed.  Constitutional:      General: He is not in acute distress.    Appearance: Normal appearance. He is well-groomed. He is obese. He is not ill-appearing.  HENT:     Head: Normocephalic.     Right Ear: Tympanic membrane, ear canal and external ear normal.     Left Ear: Tympanic membrane, ear canal and external ear normal.     Nose: Nose normal. No congestion or rhinorrhea.     Mouth/Throat:     Mouth: Mucous membranes are moist.     Pharynx: Oropharynx is clear. No posterior oropharyngeal erythema.  Eyes:     Extraocular Movements: Extraocular movements intact.     Conjunctiva/sclera: Conjunctivae normal.     Pupils: Pupils are equal, round, and reactive to light.  Cardiovascular:     Rate and Rhythm: Normal rate and regular rhythm.     Pulses: Normal pulses.     Heart sounds: Normal heart sounds. No murmur heard. Pulmonary:     Effort: Pulmonary effort is normal. No respiratory distress.     Breath sounds: Normal breath sounds. No wheezing.  Abdominal:     General: Bowel sounds are normal. There is no distension.     Palpations: Abdomen is soft.     Tenderness: There is no abdominal tenderness.  Musculoskeletal:        General: Normal range of motion.     Cervical back: Normal range of motion and neck supple.  Lymphadenopathy:     Cervical: No cervical adenopathy.  Skin:    General: Skin is warm and dry.  Neurological:     Mental Status: He is alert and oriented to person, place, and time. Mental  status is at baseline.     Cranial Nerves: Cranial nerves 2-12 are intact.     Sensory: Sensation is intact.     Motor: Motor function is intact.     Coordination: Coordination is intact.     Gait: Gait is intact.     Deep Tendon Reflexes: Reflexes are normal and symmetric.  Psychiatric:        Attention and Perception: Attention normal.        Mood and Affect: Mood normal.        Speech: Speech normal.        Behavior: Behavior normal.        Thought Content: Thought content normal.        Cognition and Memory: Cognition normal.        Judgment: Judgment normal.     Lab Results  Component Value Date   WBC 6.0 10/28/2022   HGB 14.9 10/28/2022   HCT 46.1 10/28/2022   PLT 257 10/28/2022   GLUCOSE 99 10/28/2022   CHOL 178 10/28/2022   TRIG 91 10/28/2022   HDL 53 10/28/2022   LDLCALC 108 (H) 10/28/2022   ALT 18 10/28/2022   AST 18 10/28/2022   NA 140 10/28/2022   K 4.9 10/28/2022   CL 102 10/28/2022   CREATININE 1.06 10/28/2022   BUN 13 10/28/2022   CO2 22 10/28/2022   INR 0.96 04/29/2013    Assessment & Plan:   Assessment & Plan Annual physical exam Annual physical examination Blood pressure well-controlled.  No acute concerns. - Performed head-to-toe physical examination. - Ordered lab work: PSA, thyroid, cholesterol, liver, kidney, CBC.  Things to do to keep yourself healthy  - Exercise at least 30-45 minutes a day, 3-4 days a week.  - Eat a low-fat diet with lots of fruits and vegetables, up to 7-9 servings per day.  - Seatbelts can save your life. Wear them always.  - Smoke detectors on every level of your home, check batteries every year.  - Eye Doctor - have an eye exam every 1-2 years  - Safe sex - if you may be exposed to STDs, use a condom.  - Alcohol  -  If you drink, do it moderately, less than 2 drinks per day.  - Health Care Power of Attorney. Choose someone to speak for you if you are not able.  - Depression is common in our stressful world.If you're  feeling down or losing interest in things you normally enjoy, please come in for a visit.  - Violence - If anyone is threatening or hurting you, please call immediately.  Orders:   TSH  Dyslipidemia Dyslipidemia No current cholesterol medication. Discussed potential cardiovascular benefits of intermittent use, especially with family history of cardiovascular events. Last lipids Lab Results  Component Value Date   CHOL 178 10/28/2022   HDL 53 10/28/2022   LDLCALC 108 (H) 10/28/2022   TRIG 91 10/28/2022   CHOLHDL 3.4 10/28/2022   The 10-year ASCVD risk score (Arnett DK, et al., 2019) is: 1.9%   Values used to calculate the score:     Age: 36 years     Clinically relevant sex: Male     Is Non-Hispanic African American: No     Diabetic: No     Tobacco smoker: No     Systolic Blood Pressure: 98 mmHg     Is BP treated: No     HDL Cholesterol: 53 mg/dL     Total Cholesterol: 178 mg/dL  - Evaluate cardiovascular risk score. - Consider intermittent cholesterol medication use. Orders:   Lipid Panel   Comprehensive metabolic panel with GFR   CBC with Differential  Class 1 obesity due to excess calories with serious comorbidity and body mass index (BMI) of 32.0 to 32.9 in adult BMI 34.88, not at goal Class 1 obesity Reports eating well but lacks regular exercise. - Encouraged healthy eating and regular exercise. Orders:   Vitamin D, 25-hydroxy  Male erectile disorder Male erectile dysfunction Managed with sildenafil . No new concerns. - Refilled sildenafil  prescription.  Orders:   sildenafil  (REVATIO ) 20 MG tablet; Take 1 tablet (20 mg total) by mouth 3 (three) times daily.  Screening for prostate cancer Orders:   PSA    These are the goals we discussed:  Goals      Activity and Exercise Increased     Evidence-based guidance:  Review current exercise levels.  Assess patient perspective on exercise or activity level, barriers to increasing activity, motivation and  readiness for change.  Recommend or set healthy exercise goal based on individual tolerance.  Encourage small steps toward making change in amount of exercise or activity.  Urge reduction of sedentary activities or screen time.  Promote group activities within the community or with family or support person.  Consider referral to rehabiliation therapist for assessment and exercise/activity plan.   Notes:  Pay off pills     Develop a Healthy Weight Readiness Plan-Pediatric     Timeframe:  Long-Range Goal Priority:  High Start Date:  01/03/24                           Expected End Date:    01/02/25                   Follow Up Date 01/02/2025    - begin a weight loss diary - get rid of junk food - identify what might get in the way of success - pick a start date - stock up on healthy food choices - track current amount and type of exercise    Why is this important?   Losing weight requires time to prepare.  Identify your child's/your eating habits and the emotions attached to eating.  Think about ways to be successful.  When your child/you are not ready, motivation can be lost and then your child/you may give up on the plan.     Notes:  Aim to do some physical activity for 150 minutes per week. This is typically divided into 5 days per week, 30 minutes per day. The activity should be enough to get your heart rate up. Anything is better than nothing if you have time constraints.           This is a list of the screening recommended for you and due dates:  Health Maintenance  Topic Date Due   COVID-19 Vaccine (1) Never done   Pneumococcal Vaccine for age over 40 (1 of 2 - PCV) Never done   Flu Shot  07/31/2024*   DTaP/Tdap/Td vaccine (2 - Td or Tdap) 01/02/2025*   Hepatitis B Vaccine (1 of 3 - 19+ 3-dose series) 01/02/2025*   Zoster (Shingles) Vaccine (1 of 2) 01/22/2028*   Colon Cancer Screening  02/18/2033   HPV Vaccine (No Doses Required) Completed   Hepatitis C  Screening  Completed   HIV Screening  Completed   Meningitis B Vaccine  Aged Out  *Topic was postponed. The date shown is not the original due date.     Meds ordered this encounter  Medications   sildenafil  (REVATIO ) 20 MG tablet    Sig: Take 1 tablet (20 mg total) by mouth 3 (three) times daily.    Dispense:  100 tablet    Refill:  6     Follow-up: Return in about 6 months (around 01/04/2025) for chronic, fasting, lab visit.  An After Visit Summary was printed and given to the patient.  Harrie Cedar, FNP [facility] [phone]   "

## 2024-07-07 NOTE — Patient Instructions (Signed)
" °  VISIT SUMMARY: During your annual physical exam, we discussed your blood pressure, weight management, musculoskeletal injury, and medication use. We also performed a head-to-toe physical examination and ordered lab work to check various health markers.  YOUR PLAN: -ANNUAL PHYSICAL EXAMINATION: Your blood pressure is well-controlled, and there are no acute concerns. We performed a thorough physical examination and ordered lab work to check your PSA, thyroid, cholesterol, liver, kidney, and complete blood count.  -CLASS 1 OBESITY: Class 1 obesity means your body mass index (BMI) is in the range of 30-34.9. We discussed the importance of healthy eating and regular exercise to help manage your weight.  -DYSLIPIDEMIA: Dyslipidemia means you have abnormal levels of cholesterol in your blood. We discussed the potential cardiovascular benefits of intermittent use of cholesterol medication, especially given your family history of cardiovascular events. We will evaluate your cardiovascular risk score and consider intermittent cholesterol medication use.  -MALE ERECTILE DYSFUNCTION: Erectile dysfunction means you have difficulty achieving or maintaining an erection. You are currently managing this with sildenafil , and we have refilled your prescription as there are no new concerns.  INSTRUCTIONS: Please complete the lab work we ordered to check your PSA, thyroid, cholesterol, liver, kidney, and complete blood count. Follow up with us  to discuss the results and any further steps needed.    Contains text generated by [employer].   "

## 2024-07-08 LAB — LIPID PANEL
Chol/HDL Ratio: 3.7 ratio (ref 0.0–5.0)
Cholesterol, Total: 205 mg/dL — ABNORMAL HIGH (ref 100–199)
HDL: 56 mg/dL
LDL Chol Calc (NIH): 135 mg/dL — ABNORMAL HIGH (ref 0–99)
Triglycerides: 79 mg/dL (ref 0–149)
VLDL Cholesterol Cal: 14 mg/dL (ref 5–40)

## 2024-07-08 LAB — COMPREHENSIVE METABOLIC PANEL WITH GFR
ALT: 19 [IU]/L (ref 0–44)
AST: 22 [IU]/L (ref 0–40)
Albumin: 4.8 g/dL (ref 3.8–4.9)
Alkaline Phosphatase: 79 [IU]/L (ref 47–123)
BUN/Creatinine Ratio: 13 (ref 9–20)
BUN: 15 mg/dL (ref 6–24)
Bilirubin Total: 0.6 mg/dL (ref 0.0–1.2)
CO2: 20 mmol/L (ref 20–29)
Calcium: 9.6 mg/dL (ref 8.7–10.2)
Chloride: 105 mmol/L (ref 96–106)
Creatinine, Ser: 1.16 mg/dL (ref 0.76–1.27)
Globulin, Total: 2.2 g/dL (ref 1.5–4.5)
Glucose: 109 mg/dL — ABNORMAL HIGH (ref 70–99)
Potassium: 5.2 mmol/L (ref 3.5–5.2)
Sodium: 142 mmol/L (ref 134–144)
Total Protein: 7 g/dL (ref 6.0–8.5)
eGFR: 76 mL/min/{1.73_m2}

## 2024-07-08 LAB — CBC WITH DIFFERENTIAL/PLATELET
Basophils Absolute: 0.1 10*3/uL (ref 0.0–0.2)
Basos: 2 %
EOS (ABSOLUTE): 0.5 10*3/uL — ABNORMAL HIGH (ref 0.0–0.4)
Eos: 9 %
Hematocrit: 47.5 % (ref 37.5–51.0)
Hemoglobin: 15.6 g/dL (ref 13.0–17.7)
Immature Grans (Abs): 0 10*3/uL (ref 0.0–0.1)
Immature Granulocytes: 0 %
Lymphocytes Absolute: 1.6 10*3/uL (ref 0.7–3.1)
Lymphs: 26 %
MCH: 28.8 pg (ref 26.6–33.0)
MCHC: 32.8 g/dL (ref 31.5–35.7)
MCV: 88 fL (ref 79–97)
Monocytes Absolute: 0.7 10*3/uL (ref 0.1–0.9)
Monocytes: 10 %
Neutrophils Absolute: 3.3 10*3/uL (ref 1.4–7.0)
Neutrophils: 53 %
Platelets: 218 10*3/uL (ref 150–450)
RBC: 5.41 x10E6/uL (ref 4.14–5.80)
RDW: 13.1 % (ref 11.6–15.4)
WBC: 6.3 10*3/uL (ref 3.4–10.8)

## 2024-07-08 LAB — VITAMIN D 25 HYDROXY (VIT D DEFICIENCY, FRACTURES): Vit D, 25-Hydroxy: 29.7 ng/mL — ABNORMAL LOW (ref 30.0–100.0)

## 2024-07-08 LAB — TSH: TSH: 0.84 u[IU]/mL (ref 0.450–4.500)

## 2024-07-08 LAB — PSA: Prostate Specific Ag, Serum: 0.8 ng/mL (ref 0.0–4.0)

## 2024-07-09 ENCOUNTER — Ambulatory Visit: Payer: Self-pay | Admitting: Family Medicine

## 2024-07-09 DIAGNOSIS — E559 Vitamin D deficiency, unspecified: Secondary | ICD-10-CM

## 2024-07-09 MED ORDER — VITAMIN D (ERGOCALCIFEROL) 1.25 MG (50000 UNIT) PO CAPS: 50000.0000 [IU] | ORAL_CAPSULE | ORAL | 0 refills | Status: AC

## 2025-01-05 ENCOUNTER — Ambulatory Visit: Admitting: Family Medicine
